# Patient Record
Sex: Female | Born: 1965 | Race: White | Hispanic: No | Marital: Married | Smoking: Never smoker
Health system: Southern US, Community
[De-identification: ages and names within clinical notes are randomized; demographics above are authoritative.]

## PROBLEM LIST (undated history)

## (undated) HISTORY — PX: TUBAL LIGATION: SHX77

## (undated) HISTORY — PX: FRACTURE SURGERY: SHX138

---

## 2008-02-13 ENCOUNTER — Encounter: Admission: RE | Admit: 2008-02-13 | Discharge: 2008-02-13 | Payer: Self-pay | Admitting: Obstetrics and Gynecology

## 2009-02-18 ENCOUNTER — Encounter: Admission: RE | Admit: 2009-02-18 | Discharge: 2009-02-18 | Payer: Self-pay | Admitting: Obstetrics and Gynecology

## 2010-03-06 ENCOUNTER — Encounter: Admission: RE | Admit: 2010-03-06 | Discharge: 2010-03-06 | Payer: Self-pay | Admitting: Obstetrics and Gynecology

## 2011-02-09 ENCOUNTER — Other Ambulatory Visit: Payer: Self-pay | Admitting: Obstetrics and Gynecology

## 2011-02-09 DIAGNOSIS — Z1231 Encounter for screening mammogram for malignant neoplasm of breast: Secondary | ICD-10-CM

## 2011-03-17 ENCOUNTER — Ambulatory Visit
Admission: RE | Admit: 2011-03-17 | Discharge: 2011-03-17 | Disposition: A | Discharge status: Home / Self Care | Payer: BC Managed Care – PPO | Source: Ambulatory Visit | Attending: Obstetrics and Gynecology | Admitting: Obstetrics and Gynecology

## 2011-03-17 DIAGNOSIS — Z1231 Encounter for screening mammogram for malignant neoplasm of breast: Secondary | ICD-10-CM

## 2012-02-11 ENCOUNTER — Other Ambulatory Visit: Payer: Self-pay | Admitting: Obstetrics and Gynecology

## 2012-02-11 DIAGNOSIS — Z1231 Encounter for screening mammogram for malignant neoplasm of breast: Secondary | ICD-10-CM

## 2012-03-23 ENCOUNTER — Ambulatory Visit
Admission: RE | Admit: 2012-03-23 | Discharge: 2012-03-23 | Disposition: A | Discharge status: Home / Self Care | Payer: BC Managed Care – PPO | Source: Ambulatory Visit | Attending: Obstetrics and Gynecology | Admitting: Obstetrics and Gynecology

## 2012-03-23 DIAGNOSIS — Z1231 Encounter for screening mammogram for malignant neoplasm of breast: Secondary | ICD-10-CM

## 2012-12-25 ENCOUNTER — Other Ambulatory Visit: Payer: Self-pay

## 2012-12-25 DIAGNOSIS — Z1231 Encounter for screening mammogram for malignant neoplasm of breast: Secondary | ICD-10-CM

## 2013-03-26 ENCOUNTER — Ambulatory Visit
Admission: RE | Admit: 2013-03-26 | Discharge: 2013-03-26 | Disposition: A | Discharge status: Home / Self Care | Payer: BC Managed Care – PPO | Source: Ambulatory Visit

## 2013-03-26 DIAGNOSIS — Z1231 Encounter for screening mammogram for malignant neoplasm of breast: Secondary | ICD-10-CM

## 2014-02-18 ENCOUNTER — Other Ambulatory Visit: Payer: Self-pay

## 2014-02-18 DIAGNOSIS — Z1239 Encounter for other screening for malignant neoplasm of breast: Secondary | ICD-10-CM

## 2014-04-04 ENCOUNTER — Ambulatory Visit
Admission: RE | Admit: 2014-04-04 | Discharge: 2014-04-04 | Disposition: A | Discharge status: Home / Self Care | Payer: BC Managed Care – PPO | Source: Ambulatory Visit

## 2014-04-04 ENCOUNTER — Other Ambulatory Visit: Payer: Self-pay

## 2014-04-04 DIAGNOSIS — Z1231 Encounter for screening mammogram for malignant neoplasm of breast: Secondary | ICD-10-CM

## 2015-03-19 ENCOUNTER — Other Ambulatory Visit: Payer: Self-pay

## 2015-03-19 DIAGNOSIS — Z1231 Encounter for screening mammogram for malignant neoplasm of breast: Secondary | ICD-10-CM

## 2015-05-15 ENCOUNTER — Ambulatory Visit
Admission: RE | Admit: 2015-05-15 | Discharge: 2015-05-15 | Disposition: A | Discharge status: Home / Self Care | Payer: BLUE CROSS/BLUE SHIELD | Source: Ambulatory Visit

## 2015-05-15 DIAGNOSIS — Z1231 Encounter for screening mammogram for malignant neoplasm of breast: Secondary | ICD-10-CM

## 2016-04-28 ENCOUNTER — Other Ambulatory Visit: Payer: Self-pay | Admitting: Student

## 2016-04-28 ENCOUNTER — Other Ambulatory Visit: Payer: Self-pay | Admitting: Obstetrics and Gynecology

## 2016-04-28 DIAGNOSIS — Z1231 Encounter for screening mammogram for malignant neoplasm of breast: Secondary | ICD-10-CM

## 2016-05-24 ENCOUNTER — Ambulatory Visit: Payer: BLUE CROSS/BLUE SHIELD

## 2016-05-25 ENCOUNTER — Ambulatory Visit
Admission: RE | Admit: 2016-05-25 | Discharge: 2016-05-25 | Disposition: A | Discharge status: Home / Self Care | Payer: BLUE CROSS/BLUE SHIELD | Source: Ambulatory Visit | Attending: Obstetrics and Gynecology | Admitting: Obstetrics and Gynecology

## 2016-05-25 DIAGNOSIS — Z1231 Encounter for screening mammogram for malignant neoplasm of breast: Secondary | ICD-10-CM

## 2017-05-04 ENCOUNTER — Other Ambulatory Visit: Payer: Self-pay | Admitting: Obstetrics and Gynecology

## 2017-05-04 DIAGNOSIS — Z1231 Encounter for screening mammogram for malignant neoplasm of breast: Secondary | ICD-10-CM

## 2017-06-07 ENCOUNTER — Ambulatory Visit
Admission: RE | Admit: 2017-06-07 | Discharge: 2017-06-07 | Disposition: A | Discharge status: Home / Self Care | Payer: BLUE CROSS/BLUE SHIELD | Source: Ambulatory Visit | Attending: Obstetrics and Gynecology | Admitting: Obstetrics and Gynecology

## 2017-06-07 DIAGNOSIS — Z1231 Encounter for screening mammogram for malignant neoplasm of breast: Secondary | ICD-10-CM

## 2018-07-07 ENCOUNTER — Other Ambulatory Visit: Payer: Self-pay | Admitting: Obstetrics and Gynecology

## 2018-07-07 DIAGNOSIS — Z1231 Encounter for screening mammogram for malignant neoplasm of breast: Secondary | ICD-10-CM

## 2018-08-02 ENCOUNTER — Ambulatory Visit: Payer: BLUE CROSS/BLUE SHIELD

## 2018-08-09 ENCOUNTER — Ambulatory Visit: Payer: BLUE CROSS/BLUE SHIELD

## 2019-09-26 LAB — RESULTS CONSOLE HPV: CHL HPV: NEGATIVE

## 2019-09-26 LAB — HM PAP SMEAR: HM Pap smear: NEGATIVE

## 2019-09-28 ENCOUNTER — Other Ambulatory Visit: Payer: Self-pay | Admitting: Obstetrics and Gynecology

## 2019-09-28 DIAGNOSIS — Z1231 Encounter for screening mammogram for malignant neoplasm of breast: Secondary | ICD-10-CM

## 2020-01-03 ENCOUNTER — Other Ambulatory Visit: Payer: Self-pay | Admitting: *Deleted

## 2020-01-03 DIAGNOSIS — Z1231 Encounter for screening mammogram for malignant neoplasm of breast: Secondary | ICD-10-CM

## 2020-07-15 ENCOUNTER — Other Ambulatory Visit: Payer: Self-pay

## 2020-07-15 ENCOUNTER — Ambulatory Visit (INDEPENDENT_AMBULATORY_CARE_PROVIDER_SITE_OTHER): Payer: 59 | Admitting: Nurse Practitioner

## 2020-07-15 ENCOUNTER — Encounter: Payer: Self-pay | Admitting: Nurse Practitioner

## 2020-07-15 VITALS — BP 130/68 | HR 80 | Temp 97.6°F | Ht 64.5 in | Wt 168.0 lb

## 2020-07-15 DIAGNOSIS — Z7689 Persons encountering health services in other specified circumstances: Secondary | ICD-10-CM

## 2020-07-15 DIAGNOSIS — Z1231 Encounter for screening mammogram for malignant neoplasm of breast: Secondary | ICD-10-CM | POA: Diagnosis not present

## 2020-07-15 DIAGNOSIS — Z1211 Encounter for screening for malignant neoplasm of colon: Secondary | ICD-10-CM

## 2020-07-15 NOTE — Progress Notes (Signed)
New Patient Office Visit  Subjective:  Patient ID: Monique Riggs, female    DOB: [birth year]  Age: 55 y.o. MRN: [id number]  CC:  Chief Complaint  Patient presents with  . New to establish    HPI Monique Riggs is a 55 year old Caucasian female presents to establish care with a primary care provider. She has received COVID-19, flu, and shingles immunizations. She is up-to-date on dental and eye exams. She has had recent pap smear. She is due for a screening colonoscopy and mammogram. She denies any acute medical problems. She will return to office for fasting labs.   History reviewed. No pertinent past medical history.  Past Surgical History:  Procedure Laterality Date  . CESAREAN SECTION     2    Family History  Problem Relation Age of Onset  . Prostate cancer Father   . Obesity Sister   . Breast cancer Neg Hx     Social History   Socioeconomic History  . Marital status: Married    Spouse name: Not on file  . Number of children: 2  . Years of education: Not on file  . Highest education level: Not on file  Occupational History  . Occupation: Associate Professor  Tobacco Use  . Smoking status: Never Smoker  . Smokeless tobacco: Never Used  Vaping Use  . Vaping Use: Never used  Substance and Sexual Activity  . Alcohol use: Not Currently  . Drug use: Never  . Sexual activity: Not on file  Other Topics Concern  . Not on file  Social History Narrative  . Not on file   Social Determinants of Health   Financial Resource Strain: Not on file  Food Insecurity: Not on file  Transportation Needs: Not on file  Physical Activity: Not on file  Stress: Not on file  Social Connections: Not on file  Intimate Partner Violence: Not on file    ROS Review of Systems  Constitutional: Negative for appetite change, fatigue and unexpected weight change.  HENT: Negative for congestion, ear pain, rhinorrhea, sinus pressure, sinus pain and tinnitus.   Eyes: Negative for pain.   Respiratory: Negative for cough and shortness of breath.   Cardiovascular: Negative for chest pain, palpitations and leg swelling.  Gastrointestinal: Negative for abdominal pain, constipation, diarrhea, nausea and vomiting.  Endocrine: Negative for cold intolerance, heat intolerance, polydipsia, polyphagia and polyuria.  Genitourinary: Negative for dysuria, frequency and hematuria.  Musculoskeletal: Negative for arthralgias, back pain, joint swelling and myalgias.  Skin: Negative for rash.  Allergic/Immunologic: Negative for environmental allergies.  Neurological: Negative for dizziness and headaches.  Hematological: Negative for adenopathy.  Psychiatric/Behavioral: Negative for decreased concentration and sleep disturbance. The patient is not nervous/anxious.     Objective:   Today's Vitals: BP 130/68 (BP Location: Left Arm, Patient Position: Sitting)   Pulse 80   Temp 97.6 F (36.4 C) (Temporal)   Ht 5' 4.5" (1.638 m)   Wt 168 lb (76.2 kg)   SpO2 97%   BMI 28.39 kg/m   Physical Exam Vitals reviewed.  Constitutional:      Appearance: Normal appearance.  HENT:     Head: Normocephalic.     Right Ear: Tympanic membrane normal.     Left Ear: Tympanic membrane normal.     Nose: Nose normal.     Mouth/Throat:     Mouth: Mucous membranes are moist.  Eyes:     Pupils: Pupils are equal, round, and reactive to light.  Cardiovascular:  Rate and Rhythm: Normal rate and regular rhythm.     Pulses: Normal pulses.     Heart sounds: Normal heart sounds.  Pulmonary:     Effort: Pulmonary effort is normal.     Breath sounds: Normal breath sounds.  Abdominal:     General: Bowel sounds are normal.     Palpations: Abdomen is soft.  Musculoskeletal:        General: Normal range of motion.     Cervical back: Neck supple.  Skin:    General: Skin is warm and dry.     Capillary Refill: Capillary refill takes less than 2 seconds.  Neurological:     General: No focal deficit present.      Mental Status: She is alert and oriented to person, place, and time.  Psychiatric:        Mood and Affect: Mood normal.        Behavior: Behavior normal.     Assessment & Plan:    1. Encounter for screening for malignant neoplasm of colon - Ambulatory referral to Gastroenterology  2. Encounter for screening mammogram for malignant neoplasm of breast  3. Encounter to establish care - CBC with Differential/Platelet; Future - Comprehensive metabolic panel; Future - Lipid panel; Future - TSH; Future   No outpatient encounter medications on file as of 07/15/2020.   No facility-administered encounter medications on file as of 07/15/2020.   Return on March 23rd, 2022 for fasting labs  We will call you with appointment times for mammogram on March 28th and colonoscopy referral  Follow-up in 1 year  Follow-up: 1-year  Marc Morning, NP

## 2020-07-15 NOTE — Patient Instructions (Addendum)
Return on March 23rd, 2022 for fasting labs  We will call you with appointment times for mammogram on March 28th and colonoscopy referral  Follow-up in 1 year  Preventive Care 53-53 Years Old, Female Preventive care refers to lifestyle choices and visits with your health care provider that can promote health and wellness. This includes:  A yearly physical exam. This is also called an annual wellness visit.  Regular dental and eye exams.  Immunizations.  Screening for certain conditions.  Healthy lifestyle choices, such as: ? Eating a healthy diet. ? Getting regular exercise. ? Not using drugs or products that contain nicotine and tobacco. ? Limiting alcohol use. What can I expect for my preventive care visit? Physical exam Your health care provider will check your:  Height and weight. These may be used to calculate your BMI (body mass index). BMI is a measurement that tells if you are at a healthy weight.  Heart rate and blood pressure.  Body temperature.  Skin for abnormal spots. Counseling Your health care provider may ask you questions about your:  Past medical problems.  Family's medical history.  Alcohol, tobacco, and drug use.  Emotional well-being.  Home life and relationship well-being.  Sexual activity.  Diet, exercise, and sleep habits.  Work and work Statistician.  Access to firearms.  Method of birth control.  Menstrual cycle.  Pregnancy history. What immunizations do I need? Vaccines are usually given at various ages, according to a schedule. Your health care provider will recommend vaccines for you based on your age, medical history, and lifestyle or other factors, such as travel or where you work.   What tests do I need? Blood tests  Lipid and cholesterol levels. These may be checked every 5 years, or more often if you are over 16 years old.  Hepatitis C test.  Hepatitis B test. Screening  Lung cancer screening. You may have this  screening every year starting at age 57 if you have a 30-pack-year history of smoking and currently smoke or have quit within the past 15 years.  Colorectal cancer screening. ? All adults should have this screening starting at age 58 and continuing until age 34. ? Your health care provider may recommend screening at age 27 if you are at increased risk. ? You will have tests every 1-10 years, depending on your results and the type of screening test.  Diabetes screening. ? This is done by checking your blood sugar (glucose) after you have not eaten for a while (fasting). ? You may have this done every 1-3 years.  Mammogram. ? This may be done every 1-2 years. ? Talk with your health care provider about when you should start having regular mammograms. This may depend on whether you have a family history of breast cancer.  BRCA-related cancer screening. This may be done if you have a family history of breast, ovarian, tubal, or peritoneal cancers.  Pelvic exam and Pap test. ? This may be done every 3 years starting at age 71. ? Starting at age 75, this may be done every 5 years if you have a Pap test in combination with an HPV test. Other tests  STD (sexually transmitted disease) testing, if you are at risk.  Bone density scan. This is done to screen for osteoporosis. You may have this scan if you are at high risk for osteoporosis. Talk with your health care provider about your test results, treatment options, and if necessary, the need for more tests. Follow these  instructions at home: Eating and drinking  Eat a diet that includes fresh fruits and vegetables, whole grains, lean protein, and low-fat dairy products.  Take vitamin and mineral supplements as recommended by your health care provider.  Do not drink alcohol if: ? Your health care provider tells you not to drink. ? You are pregnant, may be pregnant, or are planning to become pregnant.  If you drink alcohol: ? Limit how  much you have to 0-1 drink a day. ? Be aware of how much alcohol is in your drink. In the [place] one drink equals one 12 oz bottle of beer (355 mL), one 5 oz glass of wine (148 mL), or one 1 oz glass of hard liquor (44 mL).   Lifestyle  Take daily care of your teeth and gums. Brush your teeth every morning and night with fluoride toothpaste. Floss one time each day.  Stay active. Exercise for at least 30 minutes 5 or more days each week.  Do not use any products that contain nicotine or tobacco, such as cigarettes, e-cigarettes, and chewing tobacco. If you need help quitting, ask your health care provider.  Do not use drugs.  If you are sexually active, practice safe sex. Use a condom or other form of protection to prevent STIs (sexually transmitted infections).  If you do not wish to become pregnant, use a form of birth control. If you plan to become pregnant, see your health care provider for a prepregnancy visit.  If told by your health care provider, take low-dose aspirin daily starting at age 61.  Find healthy ways to cope with stress, such as: ? Meditation, yoga, or listening to music. ? Journaling. ? Talking to a trusted person. ? Spending time with friends and family. Safety  Always wear your seat belt while driving or riding in a vehicle.  Do not drive: ? If you have been drinking alcohol. Do not ride with someone who has been drinking. ? When you are tired or distracted. ? While texting.  Wear a helmet and other protective equipment during sports activities.  If you have firearms in your house, make sure you follow all gun safety procedures. What's next?  Visit your health care provider once a year for an annual wellness visit.  Ask your health care provider how often you should have your eyes and teeth checked.  Stay up to date on all vaccines. This information is not intended to replace advice given to you by your health care provider. Make sure you discuss any  questions you have with your health care provider. Document Revised: 01/22/2020 Document Reviewed: 12/29/2017 Elsevier Patient Education  2021 [place] Maintenance, Female Adopting a healthy lifestyle and getting preventive care are important in promoting health and wellness. Ask your health care provider about:  The right schedule for you to have regular tests and exams.  Things you can do on your own to prevent diseases and keep yourself healthy. What should I know about diet, weight, and exercise? Eat a healthy diet  Eat a diet that includes plenty of vegetables, fruits, low-fat dairy products, and lean protein.  Do not eat a lot of foods that are high in solid fats, added sugars, or sodium.   Maintain a healthy weight Body mass index (BMI) is used to identify weight problems. It estimates body fat based on height and weight. Your health care provider can help determine your BMI and help you achieve or maintain a healthy weight. Get regular exercise  Get regular exercise. This is one of the most important things you can do for your health. Most adults should:  Exercise for at least 150 minutes each week. The exercise should increase your heart rate and make you sweat (moderate-intensity exercise).  Do strengthening exercises at least twice a week. This is in addition to the moderate-intensity exercise.  Spend less time sitting. Even light physical activity can be beneficial. Watch cholesterol and blood lipids Have your blood tested for lipids and cholesterol at 53 years of age, then have this test every 5 years. Have your cholesterol levels checked more often if:  Your lipid or cholesterol levels are high.  You are older than 53 years of age.  You are at high risk for heart disease. What should I know about cancer screening? Depending on your health history and family history, you may need to have cancer screening at various ages. This may include screening  for:  Breast cancer.  Cervical cancer.  Colorectal cancer.  Skin cancer.  Lung cancer. What should I know about heart disease, diabetes, and high blood pressure? Blood pressure and heart disease  High blood pressure causes heart disease and increases the risk of stroke. This is more likely to develop in people who have high blood pressure readings, are of African descent, or are overweight.  Have your blood pressure checked: ? Every 3-5 years if you are 15-67 years of age. ? Every year if you are 64 years old or older. Diabetes Have regular diabetes screenings. This checks your fasting blood sugar level. Have the screening done:  Once every three years after age 17 if you are at a normal weight and have a low risk for diabetes.  More often and at a younger age if you are overweight or have a high risk for diabetes. What should I know about preventing infection? Hepatitis B If you have a higher risk for hepatitis B, you should be screened for this virus. Talk with your health care provider to find out if you are at risk for hepatitis B infection. Hepatitis C Testing is recommended for:  Everyone born from [birth year] through 1963.  Anyone with known risk factors for hepatitis C. Sexually transmitted infections (STIs)  Get screened for STIs, including gonorrhea and chlamydia, if: ? You are sexually active and are younger than 53 years of age. ? You are older than 53 years of age and your health care provider tells you that you are at risk for this type of infection. ? Your sexual activity has changed since you were last screened, and you are at increased risk for chlamydia or gonorrhea. Ask your health care provider if you are at risk.  Ask your health care provider about whether you are at high risk for HIV. Your health care provider may recommend a prescription medicine to help prevent HIV infection. If you choose to take medicine to prevent HIV, you should first get tested for HIV.  You should then be tested every 3 months for as long as you are taking the medicine. Pregnancy  If you are about to stop having your period (premenopausal) and you may become pregnant, seek counseling before you get pregnant.  Take 400 to 800 micrograms (mcg) of folic acid every day if you become pregnant.  Ask for birth control (contraception) if you want to prevent pregnancy. Osteoporosis and menopause Osteoporosis is a disease in which the bones lose minerals and strength with aging. This can result in bone fractures. If you  are 30 years old or older, or if you are at risk for osteoporosis and fractures, ask your health care provider if you should:  Be screened for bone loss.  Take a calcium or vitamin D supplement to lower your risk of fractures.  Be given hormone replacement therapy (HRT) to treat symptoms of menopause. Follow these instructions at home: Lifestyle  Do not use any products that contain nicotine or tobacco, such as cigarettes, e-cigarettes, and chewing tobacco. If you need help quitting, ask your health care provider.  Do not use street drugs.  Do not share needles.  Ask your health care provider for help if you need support or information about quitting drugs. Alcohol use  Do not drink alcohol if: ? Your health care provider tells you not to drink. ? You are pregnant, may be pregnant, or are planning to become pregnant.  If you drink alcohol: ? Limit how much you use to 0-1 drink a day. ? Limit intake if you are breastfeeding.  Be aware of how much alcohol is in your drink. In the [place] one drink equals one 12 oz bottle of beer (355 mL), one 5 oz glass of wine (148 mL), or one 1 oz glass of hard liquor (44 mL). General instructions  Schedule regular health, dental, and eye exams.  Stay current with your vaccines.  Tell your health care provider if: ? You often feel depressed. ? You have ever been abused or do not feel safe at  home. Summary  Adopting a healthy lifestyle and getting preventive care are important in promoting health and wellness.  Follow your health care provider's instructions about healthy diet, exercising, and getting tested or screened for diseases.  Follow your health care provider's instructions on monitoring your cholesterol and blood pressure. This information is not intended to replace advice given to you by your health care provider. Make sure you discuss any questions you have with your health care provider. Document Revised: 04/12/2018 Document Reviewed: 04/12/2018 Elsevier Patient Education  2021 [employer].

## 2020-07-23 ENCOUNTER — Other Ambulatory Visit: Payer: 59

## 2020-07-23 ENCOUNTER — Encounter: Payer: Self-pay | Admitting: Legal Medicine

## 2020-07-23 ENCOUNTER — Other Ambulatory Visit: Payer: Self-pay

## 2020-07-23 ENCOUNTER — Ambulatory Visit (INDEPENDENT_AMBULATORY_CARE_PROVIDER_SITE_OTHER): Payer: 59 | Admitting: Legal Medicine

## 2020-07-23 VITALS — BP 118/80 | HR 57 | Temp 97.5°F | Resp 16 | Ht 64.5 in | Wt 167.4 lb

## 2020-07-23 DIAGNOSIS — H6090 Unspecified otitis externa, unspecified ear: Secondary | ICD-10-CM | POA: Insufficient documentation

## 2020-07-23 DIAGNOSIS — Z7689 Persons encountering health services in other specified circumstances: Secondary | ICD-10-CM

## 2020-07-23 DIAGNOSIS — H60391 Other infective otitis externa, right ear: Secondary | ICD-10-CM

## 2020-07-23 MED ORDER — AZITHROMYCIN 250 MG PO TABS: ORAL_TABLET | ORAL | 0 refills | Status: DC

## 2020-07-23 MED ORDER — NEOMYCIN-POLYMYXIN-HC 3.5-10000-1 OT SOLN: 3.0000 [drp] | Freq: Four times a day (QID) | OTIC | 0 refills | Status: DC

## 2020-07-23 NOTE — Progress Notes (Signed)
Subjective:  Patient ID: Monique Riggs, female    DOB: October 31, 1965  Age: 55 y.o. MRN: [id number]  Chief Complaint  Patient presents with  . Ear Pain    R ear; Patient states she was here last week and Carollee Herter told her that her ear was red but it was not bothering her at time but now it is sore.     HPI: patient having otalgia, she was seen one week ago.no popping, constant pain, no fever or chills, no congestion.   No current outpatient medications on file prior to visit.   No current facility-administered medications on file prior to visit.   History reviewed. No pertinent past medical history. Past Surgical History:  Procedure Laterality Date  . CESAREAN SECTION     2    Family History  Problem Relation Age of Onset  . Prostate cancer Father   . Obesity Sister   . Breast cancer Neg Hx    Social History   Socioeconomic History  . Marital status: Married    Spouse name: Not on file  . Number of children: 2  . Years of education: Not on file  . Highest education level: Not on file  Occupational History  . Occupation: Associate Professor  Tobacco Use  . Smoking status: Never Smoker  . Smokeless tobacco: Never Used  Vaping Use  . Vaping Use: Never used  Substance and Sexual Activity  . Alcohol use: Not Currently  . Drug use: Never  . Sexual activity: Not on file  Other Topics Concern  . Not on file  Social History Narrative  . Not on file   Social Determinants of Health   Financial Resource Strain: Not on file  Food Insecurity: Not on file  Transportation Needs: Not on file  Physical Activity: Not on file  Stress: Not on file  Social Connections: Not on file    Review of Systems  Constitutional: Negative for activity change and appetite change.  HENT: Positive for ear pain.   Eyes: Negative for visual disturbance.  Respiratory: Negative for chest tightness and shortness of breath.   Cardiovascular: Negative for chest pain, palpitations and leg swelling.   Endocrine: Negative for polyuria.  Genitourinary: Positive for urgency. Negative for dysuria.  Musculoskeletal: Negative.   Skin: Negative.      Objective:  BP 118/80 (BP Location: Right Arm, Patient Position: Sitting, Cuff Size: Normal)   Pulse (!) 57   Temp (!) 97.5 F (36.4 C) (Temporal)   Resp 16   Ht 5' 4.5" (1.638 m)   Wt 167 lb 6.4 oz (75.9 kg)   SpO2 99%   BMI 28.29 kg/m   BP/Weight 07/23/2020 07/15/2020  Systolic BP 118 130  Diastolic BP 80 68  Wt. (Lbs) 167.4 168  BMI 28.29 28.39    Physical Exam Vitals reviewed.  Constitutional:      Appearance: Normal appearance. She is normal weight.  HENT:     Left Ear: Tympanic membrane normal.     Ears:     Comments: Right TM red and bulging, the ear canal is red and swollen and tender Eyes:     Extraocular Movements: Extraocular movements intact.     Pupils: Pupils are equal, round, and reactive to light.  Cardiovascular:     Rate and Rhythm: Normal rate and regular rhythm.     Pulses: Normal pulses.     Heart sounds: Normal heart sounds. No murmur heard. No gallop.   Pulmonary:  Effort: Pulmonary effort is normal. No respiratory distress.     Breath sounds: Normal breath sounds. No rales.  Musculoskeletal:     Cervical back: Normal range of motion and neck supple.  Neurological:     Mental Status: She is alert.       No results found for: WBC, HGB, HCT, PLT, GLUCOSE, CHOL, TRIG, HDL, LDLDIRECT, LDLCALC, ALT, AST, NA, K, CL, CREATININE, BUN, CO2, TSH, PSA, INR, GLUF, HGBA1C, MICROALBUR    Assessment & Plan:   Diagnoses and all orders for this visit: Other infective acute otitis externa of right ear -     azithromycin (ZITHROMAX) 250 MG tablet; 2 tablets on day 1, then 1 tablet daily on days 2-6. -     neomycin-polymyxin-hydrocortisone (CORTISPORIN) OTIC solution; Place 3 drops into the left ear 4 (four) times daily. Treat with ear drops, use cotton, take antibiotics, call if not improved. Encounter  to establish care -     Comprehensive metabolic panel -     Lipid panel -     TSH -     CBC with Differential/Platelet  Labs for her physical last visit       I spent 15 minutes dedicated to the care of this patient on the date of this encounter to include face-to-face time with the patient, as well as:   Follow-up: Return if symptoms worsen or fail to improve.  An After Visit Summary was printed and given to the patient.  Brent Bulla, MD [facility] [phone]

## 2020-07-24 ENCOUNTER — Encounter: Payer: Self-pay | Admitting: Nurse Practitioner

## 2020-07-24 LAB — CBC WITH DIFFERENTIAL/PLATELET
Basophils Absolute: 0 10*3/uL (ref 0.0–0.2)
Basos: 1 %
EOS (ABSOLUTE): 0.1 10*3/uL (ref 0.0–0.4)
Eos: 2 %
Hematocrit: 44.8 % (ref 34.0–46.6)
Hemoglobin: 14.5 g/dL (ref 11.1–15.9)
Immature Grans (Abs): 0 10*3/uL (ref 0.0–0.1)
Immature Granulocytes: 0 %
Lymphocytes Absolute: 1.3 10*3/uL (ref 0.7–3.1)
Lymphs: 19 %
MCH: 30.5 pg (ref 26.6–33.0)
MCHC: 32.4 g/dL (ref 31.5–35.7)
MCV: 94 fL (ref 79–97)
Monocytes Absolute: 0.4 10*3/uL (ref 0.1–0.9)
Monocytes: 5 %
Neutrophils Absolute: 4.7 10*3/uL (ref 1.4–7.0)
Neutrophils: 73 %
Platelets: 242 10*3/uL (ref 150–450)
RBC: 4.75 x10E6/uL (ref 3.77–5.28)
RDW: 12.9 % (ref 11.7–15.4)
WBC: 6.6 10*3/uL (ref 3.4–10.8)

## 2020-07-24 LAB — COMPREHENSIVE METABOLIC PANEL
ALT: 11 IU/L (ref 0–32)
AST: 17 IU/L (ref 0–40)
Albumin/Globulin Ratio: 2.3 — ABNORMAL HIGH (ref 1.2–2.2)
Albumin: 4.5 g/dL (ref 3.8–4.9)
Alkaline Phosphatase: 107 IU/L (ref 44–121)
BUN/Creatinine Ratio: 23 (ref 9–23)
BUN: 16 mg/dL (ref 6–24)
Bilirubin Total: 0.3 mg/dL (ref 0.0–1.2)
CO2: 23 mmol/L (ref 20–29)
Calcium: 9.2 mg/dL (ref 8.7–10.2)
Chloride: 104 mmol/L (ref 96–106)
Creatinine, Ser: 0.69 mg/dL (ref 0.57–1.00)
Globulin, Total: 2 g/dL (ref 1.5–4.5)
Glucose: 86 mg/dL (ref 65–99)
Potassium: 4.4 mmol/L (ref 3.5–5.2)
Sodium: 142 mmol/L (ref 134–144)
Total Protein: 6.5 g/dL (ref 6.0–8.5)
eGFR: 104 mL/min/{1.73_m2} (ref >59)

## 2020-07-24 LAB — LIPID PANEL
Chol/HDL Ratio: 3 ratio (ref 0.0–4.4)
Cholesterol, Total: 151 mg/dL (ref 100–199)
HDL: 50 mg/dL (ref >39)
LDL Chol Calc (NIH): 88 mg/dL (ref 0–99)
Triglycerides: 62 mg/dL (ref 0–149)
VLDL Cholesterol Cal: 13 mg/dL (ref 5–40)

## 2020-07-24 LAB — CARDIOVASCULAR RISK ASSESSMENT

## 2020-07-24 LAB — TSH: TSH: 3.49 u[IU]/mL (ref 0.450–4.500)

## 2020-07-28 ENCOUNTER — Other Ambulatory Visit: Payer: Self-pay

## 2020-07-28 ENCOUNTER — Ambulatory Visit
Admission: RE | Admit: 2020-07-28 | Discharge: 2020-07-28 | Disposition: A | Discharge status: Home / Self Care | Payer: 59 | Source: Ambulatory Visit | Attending: Nurse Practitioner | Admitting: Nurse Practitioner

## 2020-07-28 DIAGNOSIS — Z1231 Encounter for screening mammogram for malignant neoplasm of breast: Secondary | ICD-10-CM

## 2020-08-06 ENCOUNTER — Encounter: Payer: Self-pay | Admitting: Nurse Practitioner

## 2021-07-22 ENCOUNTER — Encounter: Payer: Self-pay | Admitting: Nurse Practitioner

## 2021-07-22 ENCOUNTER — Other Ambulatory Visit: Payer: Self-pay

## 2021-07-22 ENCOUNTER — Ambulatory Visit (INDEPENDENT_AMBULATORY_CARE_PROVIDER_SITE_OTHER): Payer: Managed Care, Other (non HMO) | Admitting: Nurse Practitioner

## 2021-07-22 VITALS — BP 110/80 | HR 81 | Temp 97.1°F | Ht 64.0 in | Wt 159.0 lb

## 2021-07-22 DIAGNOSIS — Z1211 Encounter for screening for malignant neoplasm of colon: Secondary | ICD-10-CM

## 2021-07-22 DIAGNOSIS — Z0000 Encounter for general adult medical examination without abnormal findings: Secondary | ICD-10-CM | POA: Diagnosis not present

## 2021-07-22 DIAGNOSIS — Z1231 Encounter for screening mammogram for malignant neoplasm of breast: Secondary | ICD-10-CM

## 2021-07-22 NOTE — Patient Instructions (Signed)
Continue heart healthy diet ?Continue physical activity ?Follow-up in 1-year ? ?Preventive Care 21-54 Years Old, Female ?Preventive care refers to lifestyle choices and visits with your health care provider that can promote health and wellness. Preventive care visits are also called wellness exams. ?What can I expect for my preventive care visit? ?Counseling ?Your health care provider may ask you questions about your: ?Medical history, including: ?Past medical problems. ?Family medical history. ?Pregnancy history. ?Current health, including: ?Menstrual cycle. ?Method of birth control. ?Emotional well-being. ?Home life and relationship well-being. ?Sexual activity and sexual health. ?Lifestyle, including: ?Alcohol, nicotine or tobacco, and drug use. ?Access to firearms. ?Diet, exercise, and sleep habits. ?Work and work Statistician. ?Sunscreen use. ?Safety issues such as seatbelt and bike helmet use. ?Physical exam ?Your health care provider will check your: ?Height and weight. These may be used to calculate your BMI (body mass index). BMI is a measurement that tells if you are at a healthy weight. ?Waist circumference. This measures the distance around your waistline. This measurement also tells if you are at a healthy weight and may help predict your risk of certain diseases, such as type 2 diabetes and high blood pressure. ?Heart rate and blood pressure. ?Body temperature. ?Skin for abnormal spots. ?What immunizations do I need? ?Vaccines are usually given at various ages, according to a schedule. Your health care provider will recommend vaccines for you based on your age, medical history, and lifestyle or other factors, such as travel or where you work. ?What tests do I need? ?Screening ?Your health care provider may recommend screening tests for certain conditions. This may include: ?Lipid and cholesterol levels. ?Diabetes screening. This is done by checking your blood sugar (glucose) after you have not eaten for  a while (fasting). ?Pelvic exam and Pap test. ?Hepatitis B test. ?Hepatitis C test. ?HIV (human immunodeficiency virus) test. ?STI (sexually transmitted infection) testing, if you are at risk. ?Lung cancer screening. ?Colorectal cancer screening. ?Mammogram. Talk with your health care provider about when you should start having regular mammograms. This may depend on whether you have a family history of breast cancer. ?BRCA-related cancer screening. This may be done if you have a family history of breast, ovarian, tubal, or peritoneal cancers. ?Bone density scan. This is done to screen for osteoporosis. ?Talk with your health care provider about your test results, treatment options, and if necessary, the need for more tests. ?Follow these instructions at home: ?Eating and drinking ? ?Eat a diet that includes fresh fruits and vegetables, whole grains, lean protein, and low-fat dairy products. ?Take vitamin and mineral supplements as recommended by your health care provider. ?Do not drink alcohol if: ?Your health care provider tells you not to drink. ?You are pregnant, may be pregnant, or are planning to become pregnant. ?If you drink alcohol: ?Limit how much you have to 0-1 drink a day. ?Know how much alcohol is in your drink. In the [place] one drink equals one 12 oz bottle of beer (355 mL), one 5 oz glass of wine (148 mL), or one 1? oz glass of hard liquor (44 mL). ?Lifestyle ?Brush your teeth every morning and night with fluoride toothpaste. Floss one time each day. ?Exercise for at least 30 minutes 5 or more days each week. ?Do not use any products that contain nicotine or tobacco. These products include cigarettes, chewing tobacco, and vaping devices, such as e-cigarettes. If you need help quitting, ask your health care provider. ?Do not use drugs. ?If you are sexually active, practice safe  sex. Use a condom or other form of protection to prevent STIs. ?If you do not wish to become pregnant, use a form of birth  control. If you plan to become pregnant, see your health care provider for a prepregnancy visit. ?Take aspirin only as told by your health care provider. Make sure that you understand how much to take and what form to take. Work with your health care provider to find out whether it is safe and beneficial for you to take aspirin daily. ?Find healthy ways to manage stress, such as: ?Meditation, yoga, or listening to music. ?Journaling. ?Talking to a trusted person. ?Spending time with friends and family. ?Minimize exposure to UV radiation to reduce your risk of skin cancer. ?Safety ?Always wear your seat belt while driving or riding in a vehicle. ?Do not drive: ?If you have been drinking alcohol. Do not ride with someone who has been drinking. ?When you are tired or distracted. ?While texting. ?If you have been using any mind-altering substances or drugs. ?Wear a helmet and other protective equipment during sports activities. ?If you have firearms in your house, make sure you follow all gun safety procedures. ?Seek help if you have been physically or sexually abused. ?What's next? ?Visit your health care provider once a year for an annual wellness visit. ?Ask your health care provider how often you should have your eyes and teeth checked. ?Stay up to date on all vaccines. ?This information is not intended to replace advice given to you by your health care provider. Make sure you discuss any questions you have with your health care provider. ?Document Revised: 10/15/2020 Document Reviewed: 10/15/2020 ?Elsevier Patient Education ? [place]. ? ?

## 2021-07-22 NOTE — Progress Notes (Signed)
? ?Subjective:  ?Patient ID: Monique Riggs, female    DOB: [birth year]  Age: 56 y.o. MRN: [id number] ? ?Chief Complaint  ?Patient presents with  ? Annual Exam  ? ? ?HPI ?Encounter for general adult medical examination without abnormal findings  ?Physical: Patient's last physical exam was 1 year ago .  ? ?   ?SDOH Screenings  ? ?Alcohol Screen: Low Risk   ? Last Alcohol Screening Score (AUDIT): 0  ?Depression (PHQ2-9): Low Risk   ? PHQ-2 Score: 0  ?Financial Resource Strain: Low Risk   ? Difficulty of Paying Living Expenses: Not hard at all  ?Food Insecurity: No Food Insecurity  ? Worried About Programme researcher, broadcasting/film/video in the Last Year: Never true  ? Ran Out of Food in the Last Year: Never true  ?Housing: Low Risk   ? Last Housing Risk Score: 0  ?Physical Activity: Inactive  ? Days of Exercise per Week: 0 days  ? Minutes of Exercise per Session: 0 min  ?Social Connections: Moderately Integrated  ? Frequency of Communication with Friends and Family: Three times a week  ? Frequency of Social Gatherings with Friends and Family: Three times a week  ? Attends Religious Services: More than 4 times per year  ? Active Member of Clubs or Organizations: No  ? Attends Banker Meetings: Never  ? Marital Status: Married  ?Stress: No Stress Concern Present  ? Feeling of Stress : Not at all  ?Tobacco Use: Low Risk   ? Smoking Tobacco Use: Never  ? Smokeless Tobacco Use: Never  ? Passive Exposure: Not on file  ?Transportation Needs: No Transportation Needs  ? Lack of Transportation (Medical): No  ? Lack of Transportation (Non-Medical): No  ?  ? ?  07/22/2021  ? 10:29 AM 07/15/2020  ?  1:57 PM  ?Fall Risk   ?Falls in the past year? 0 0  ?Number falls in past yr: 0 0  ?Injury with Fall? 0 0  ?Risk for fall due to : No Fall Risks   ?Follow up Falls evaluation completed Falls evaluation completed  ?  ? ?  07/22/2021  ? 10:29 AM 07/15/2020  ?  2:03 PM  ?Depression screen PHQ 2/9  ?Decreased Interest 0 0  ?Down, Depressed, Hopeless 0 0   ?PHQ - 2 Score 0 0  ?  ?Functional Status Survey: ?   ? ?Safety: reviewed ;  ?Patient wears a seat belt. ?Patient's home has smoke detectors and carbon monoxide detectors. ?Patient practices appropriate gun safety ?Patient wears sunscreen with extended sun exposure. ?Dental Care: biannual cleanings, brushes daily but does not floss daily. ?Ophthalmology/Optometry: Annual visit.  ?Hearing loss: none ?Vision impairments: Contacts ?Patient is not afflicted from Stress Incontinence and Urge Incontinence  ? ?No current outpatient medications on file prior to visit.  ? ?No current facility-administered medications on file prior to visit.  ?  ?Social Hx   ?Social History  ? ?Socioeconomic History  ? Marital status: Married  ?  Spouse name: Monique Riggs  ? Number of children: 2  ? Years of education: Not on file  ? Highest education level: Not on file  ?Occupational History  ? Occupation: Associate Professor  ?Tobacco Use  ? Smoking status: Never  ? Smokeless tobacco: Never  ?Vaping Use  ? Vaping Use: Never used  ?Substance and Sexual Activity  ? Alcohol use: Not Currently  ? Drug use: Never  ? Sexual activity: Yes  ?  Partners: Male  ?Other Topics  Concern  ? Not on file  ?Social History Narrative  ? Not on file  ? ?Social Determinants of Health  ? ?Financial Resource Strain: Low Risk   ? Difficulty of Paying Living Expenses: Not hard at all  ?Food Insecurity: No Food Insecurity  ? Worried About Programme researcher, broadcasting/film/videounning Out of Food in the Last Year: Never true  ? Ran Out of Food in the Last Year: Never true  ?Transportation Needs: No Transportation Needs  ? Lack of Transportation (Medical): No  ? Lack of Transportation (Non-Medical): No  ?Physical Activity: Inactive  ? Days of Exercise per Week: 0 days  ? Minutes of Exercise per Session: 0 min  ?Stress: No Stress Concern Present  ? Feeling of Stress : Not at all  ?Social Connections: Moderately Integrated  ? Frequency of Communication with Friends and Family: Three times a week  ? Frequency of  Social Gatherings with Friends and Family: Three times a week  ? Attends Religious Services: More than 4 times per year  ? Active Member of Clubs or Organizations: No  ? Attends BankerClub or Organization Meetings: Never  ? Marital Status: Married  ? ?No past medical history on file. ?Family History  ?Problem Relation Age of Onset  ? Prostate cancer Father   ? Obesity Sister   ? Breast cancer Neg Hx   ? ? ?Review of Systems  ?Constitutional:  Positive for diaphoresis (night sweats due to menopause). Negative for chills, fatigue and fever.  ?HENT:  Negative for congestion, ear pain, rhinorrhea and sore throat.   ?Respiratory:  Negative for cough and shortness of breath.   ?Cardiovascular:  Negative for chest pain.  ?Gastrointestinal:  Negative for abdominal pain, constipation, diarrhea, nausea and vomiting.  ?Genitourinary:  Negative for dysuria and urgency.  ?Musculoskeletal:  Negative for back pain and myalgias.  ?Neurological:  Negative for dizziness, weakness, light-headedness and headaches.  ?Psychiatric/Behavioral:  Negative for dysphoric mood. The patient is not nervous/anxious.   ? ? ?Objective:  ?BP 110/80   Pulse 81   Temp (!) 97.1 ?F (36.2 ?C)   Ht 5\' 4"  (1.626 m)   Wt 159 lb (72.1 kg)   LMP 06/05/2017   SpO2 97%   BMI 27.29 kg/m?  ? ? ?  07/22/2021  ? 10:32 AM 07/23/2020  ?  9:45 AM 07/15/2020  ?  2:08 PM  ?BP/Weight  ?Systolic BP 110 118 130  ?Diastolic BP 80 80 68  ?Wt. (Lbs) 159 167.4 168  ?BMI 27.29 kg/m2 28.29 kg/m2 28.39 kg/m2  ? ? ?Physical Exam ?Vitals reviewed.  ?Constitutional:   ?   Appearance: Normal appearance.  ?HENT:  ?   Head: Normocephalic.  ?   Right Ear: Tympanic membrane normal.  ?   Left Ear: Tympanic membrane normal.  ?   Nose: Nose normal.  ?   Mouth/Throat:  ?   Mouth: Mucous membranes are moist.  ?Eyes:  ?   Pupils: Pupils are equal, round, and reactive to light.  ?Cardiovascular:  ?   Rate and Rhythm: Normal rate and regular rhythm.  ?   Pulses: Normal pulses.  ?   Heart sounds:  Normal heart sounds.  ?Pulmonary:  ?   Effort: Pulmonary effort is normal.  ?   Breath sounds: Normal breath sounds.  ?Abdominal:  ?   General: Bowel sounds are normal.  ?   Palpations: Abdomen is soft.  ?Musculoskeletal:     ?   General: Normal range of motion.  ?   Cervical back:  Neck supple.  ?Skin: ?   General: Skin is warm and dry.  ?   Capillary Refill: Capillary refill takes less than 2 seconds.  ?Neurological:  ?   General: No focal deficit present.  ?   Mental Status: She is alert and oriented to person, place, and time.  ?Psychiatric:     ?   Mood and Affect: Mood normal.     ?   Behavior: Behavior normal.  ? ? ?Lab Results  ?Component Value Date  ? WBC 6.6 07/23/2020  ? HGB 14.5 07/23/2020  ? HCT 44.8 07/23/2020  ? PLT 242 07/23/2020  ? GLUCOSE 86 07/23/2020  ? CHOL 151 07/23/2020  ? TRIG 62 07/23/2020  ? HDL 50 07/23/2020  ? LDLCALC 88 07/23/2020  ? ALT 11 07/23/2020  ? AST 17 07/23/2020  ? NA 142 07/23/2020  ? K 4.4 07/23/2020  ? CL 104 07/23/2020  ? CREATININE 0.69 07/23/2020  ? BUN 16 07/23/2020  ? CO2 23 07/23/2020  ? TSH 3.490 07/23/2020  ? ? ? ? ?Assessment & Plan:  ?  ?1. General medical exam ?- CBC with Differential/Platelet ?- Comprehensive metabolic panel ?- TSH ?- Lipid Panel ? ?2. Screening for colon cancer ?- Ambulatory referral to Gastroenterology ? ?3. Screening mammogram for breast cancer ?- MM DIGITAL SCREENING BILATERAL; Future ?  ? ? ?These are the goals we discussed: ? Goals   ?Obtain colonoscopy and mammogram ?  ?  ? ?This is a list of the screening recommended for you and due dates:  ?Health Maintenance  ?Topic Date Due  ? Zoster (Shingles) Vaccine (1 of 2) Never done  ? Pap Smear  07/22/2021*  ? Colon Cancer Screening  07/23/2022*  ? Mammogram  07/29/2022  ? Tetanus Vaccine  01/23/2031  ? Flu Shot  Completed  ? HPV Vaccine  Aged Out  ? COVID-19 Vaccine  Discontinued  ? Hepatitis C Screening: USPSTF Recommendation to screen - Ages 35-79 yo.  Discontinued  ? HIV Screening   Discontinued  ?*Topic was postponed. The date shown is not the original due date.  ?  ?Continue heart healthy diet ?Continue physical activity ?Follow-up in 1-year ? ?AN INDIVIDUALIZED CARE PLAN: was established or rein

## 2021-07-23 LAB — CBC WITH DIFFERENTIAL/PLATELET
Basophils Absolute: 0 10*3/uL (ref 0.0–0.2)
Basos: 1 %
EOS (ABSOLUTE): 0.1 10*3/uL (ref 0.0–0.4)
Eos: 1 %
Hematocrit: 45.2 % (ref 34.0–46.6)
Hemoglobin: 15.1 g/dL (ref 11.1–15.9)
Immature Grans (Abs): 0 10*3/uL (ref 0.0–0.1)
Immature Granulocytes: 0 %
Lymphocytes Absolute: 1.2 10*3/uL (ref 0.7–3.1)
Lymphs: 15 %
MCH: 31.2 pg (ref 26.6–33.0)
MCHC: 33.4 g/dL (ref 31.5–35.7)
MCV: 93 fL (ref 79–97)
Monocytes Absolute: 0.5 10*3/uL (ref 0.1–0.9)
Monocytes: 6 %
Neutrophils Absolute: 6.3 10*3/uL (ref 1.4–7.0)
Neutrophils: 77 %
Platelets: 264 10*3/uL (ref 150–450)
RBC: 4.84 x10E6/uL (ref 3.77–5.28)
RDW: 12.7 % (ref 11.7–15.4)
WBC: 8.1 10*3/uL (ref 3.4–10.8)

## 2021-07-23 LAB — LIPID PANEL
Chol/HDL Ratio: 2.8 ratio (ref 0.0–4.4)
Cholesterol, Total: 164 mg/dL (ref 100–199)
HDL: 59 mg/dL (ref >39)
LDL Chol Calc (NIH): 90 mg/dL (ref 0–99)
Triglycerides: 77 mg/dL (ref 0–149)
VLDL Cholesterol Cal: 15 mg/dL (ref 5–40)

## 2021-07-23 LAB — TSH: TSH: 1.81 u[IU]/mL (ref 0.450–4.500)

## 2021-07-23 LAB — COMPREHENSIVE METABOLIC PANEL
ALT: 13 IU/L (ref 0–32)
AST: 14 IU/L (ref 0–40)
Albumin/Globulin Ratio: 2.5 — ABNORMAL HIGH (ref 1.2–2.2)
Albumin: 4.9 g/dL (ref 3.8–4.9)
Alkaline Phosphatase: 88 IU/L (ref 44–121)
BUN/Creatinine Ratio: 35 — ABNORMAL HIGH (ref 9–23)
BUN: 26 mg/dL — ABNORMAL HIGH (ref 6–24)
Bilirubin Total: 0.4 mg/dL (ref 0.0–1.2)
CO2: 25 mmol/L (ref 20–29)
Calcium: 9.8 mg/dL (ref 8.7–10.2)
Chloride: 104 mmol/L (ref 96–106)
Creatinine, Ser: 0.74 mg/dL (ref 0.57–1.00)
Globulin, Total: 2 g/dL (ref 1.5–4.5)
Glucose: 85 mg/dL (ref 70–99)
Potassium: 4.5 mmol/L (ref 3.5–5.2)
Sodium: 146 mmol/L — ABNORMAL HIGH (ref 134–144)
Total Protein: 6.9 g/dL (ref 6.0–8.5)
eGFR: 97 mL/min/{1.73_m2} (ref >59)

## 2021-07-23 LAB — CARDIOVASCULAR RISK ASSESSMENT

## 2021-07-28 ENCOUNTER — Other Ambulatory Visit: Payer: Self-pay | Admitting: Nurse Practitioner

## 2021-07-28 DIAGNOSIS — Z1231 Encounter for screening mammogram for malignant neoplasm of breast: Secondary | ICD-10-CM

## 2021-07-29 ENCOUNTER — Other Ambulatory Visit: Payer: Self-pay

## 2021-07-29 ENCOUNTER — Ambulatory Visit
Admission: RE | Admit: 2021-07-29 | Discharge: 2021-07-29 | Disposition: A | Discharge status: Home / Self Care | Payer: Managed Care, Other (non HMO) | Source: Ambulatory Visit | Attending: Nurse Practitioner | Admitting: Nurse Practitioner

## 2021-07-29 DIAGNOSIS — Z1231 Encounter for screening mammogram for malignant neoplasm of breast: Secondary | ICD-10-CM

## 2022-01-08 ENCOUNTER — Other Ambulatory Visit: Payer: Self-pay

## 2022-01-08 DIAGNOSIS — Z1211 Encounter for screening for malignant neoplasm of colon: Secondary | ICD-10-CM

## 2022-01-13 ENCOUNTER — Encounter: Payer: Self-pay | Admitting: Nurse Practitioner

## 2022-01-24 DIAGNOSIS — S8263XA Displaced fracture of lateral malleolus of unspecified fibula, initial encounter for closed fracture: Secondary | ICD-10-CM | POA: Diagnosis not present

## 2022-01-25 DIAGNOSIS — M25572 Pain in left ankle and joints of left foot: Secondary | ICD-10-CM | POA: Diagnosis not present

## 2022-01-28 DIAGNOSIS — S93432A Sprain of tibiofibular ligament of left ankle, initial encounter: Secondary | ICD-10-CM | POA: Diagnosis not present

## 2022-01-28 DIAGNOSIS — W19XXXA Unspecified fall, initial encounter: Secondary | ICD-10-CM | POA: Diagnosis not present

## 2022-01-28 DIAGNOSIS — X58XXXA Exposure to other specified factors, initial encounter: Secondary | ICD-10-CM | POA: Diagnosis not present

## 2022-01-28 DIAGNOSIS — S93492A Sprain of other ligament of left ankle, initial encounter: Secondary | ICD-10-CM | POA: Diagnosis not present

## 2022-01-28 DIAGNOSIS — S8262XA Displaced fracture of lateral malleolus of left fibula, initial encounter for closed fracture: Secondary | ICD-10-CM | POA: Diagnosis not present

## 2022-01-28 DIAGNOSIS — G8918 Other acute postprocedural pain: Secondary | ICD-10-CM | POA: Diagnosis not present

## 2022-01-28 DIAGNOSIS — S8261XA Displaced fracture of lateral malleolus of right fibula, initial encounter for closed fracture: Secondary | ICD-10-CM | POA: Diagnosis not present

## 2022-02-10 DIAGNOSIS — S8262XA Displaced fracture of lateral malleolus of left fibula, initial encounter for closed fracture: Secondary | ICD-10-CM | POA: Diagnosis not present

## 2022-02-10 DIAGNOSIS — M25572 Pain in left ankle and joints of left foot: Secondary | ICD-10-CM | POA: Diagnosis not present

## 2022-03-10 DIAGNOSIS — M25572 Pain in left ankle and joints of left foot: Secondary | ICD-10-CM | POA: Diagnosis not present

## 2022-03-17 DIAGNOSIS — M25572 Pain in left ankle and joints of left foot: Secondary | ICD-10-CM | POA: Diagnosis not present

## 2022-03-17 DIAGNOSIS — R262 Difficulty in walking, not elsewhere classified: Secondary | ICD-10-CM | POA: Diagnosis not present

## 2022-03-19 DIAGNOSIS — M25572 Pain in left ankle and joints of left foot: Secondary | ICD-10-CM | POA: Diagnosis not present

## 2022-03-19 DIAGNOSIS — R262 Difficulty in walking, not elsewhere classified: Secondary | ICD-10-CM | POA: Diagnosis not present

## 2022-03-22 DIAGNOSIS — M25572 Pain in left ankle and joints of left foot: Secondary | ICD-10-CM | POA: Diagnosis not present

## 2022-03-22 DIAGNOSIS — R262 Difficulty in walking, not elsewhere classified: Secondary | ICD-10-CM | POA: Diagnosis not present

## 2022-03-24 DIAGNOSIS — M25572 Pain in left ankle and joints of left foot: Secondary | ICD-10-CM | POA: Diagnosis not present

## 2022-03-24 DIAGNOSIS — R262 Difficulty in walking, not elsewhere classified: Secondary | ICD-10-CM | POA: Diagnosis not present

## 2022-03-30 DIAGNOSIS — R262 Difficulty in walking, not elsewhere classified: Secondary | ICD-10-CM | POA: Diagnosis not present

## 2022-03-30 DIAGNOSIS — M25572 Pain in left ankle and joints of left foot: Secondary | ICD-10-CM | POA: Diagnosis not present

## 2022-03-31 DIAGNOSIS — M25572 Pain in left ankle and joints of left foot: Secondary | ICD-10-CM | POA: Diagnosis not present

## 2022-03-31 DIAGNOSIS — R262 Difficulty in walking, not elsewhere classified: Secondary | ICD-10-CM | POA: Diagnosis not present

## 2022-04-01 DIAGNOSIS — M25572 Pain in left ankle and joints of left foot: Secondary | ICD-10-CM | POA: Diagnosis not present

## 2022-04-01 DIAGNOSIS — R262 Difficulty in walking, not elsewhere classified: Secondary | ICD-10-CM | POA: Diagnosis not present

## 2022-04-05 DIAGNOSIS — R262 Difficulty in walking, not elsewhere classified: Secondary | ICD-10-CM | POA: Diagnosis not present

## 2022-04-05 DIAGNOSIS — M25572 Pain in left ankle and joints of left foot: Secondary | ICD-10-CM | POA: Diagnosis not present

## 2022-04-07 DIAGNOSIS — M25572 Pain in left ankle and joints of left foot: Secondary | ICD-10-CM | POA: Diagnosis not present

## 2022-04-07 DIAGNOSIS — R262 Difficulty in walking, not elsewhere classified: Secondary | ICD-10-CM | POA: Diagnosis not present

## 2022-04-09 DIAGNOSIS — R262 Difficulty in walking, not elsewhere classified: Secondary | ICD-10-CM | POA: Diagnosis not present

## 2022-04-09 DIAGNOSIS — M25572 Pain in left ankle and joints of left foot: Secondary | ICD-10-CM | POA: Diagnosis not present

## 2022-04-12 DIAGNOSIS — R262 Difficulty in walking, not elsewhere classified: Secondary | ICD-10-CM | POA: Diagnosis not present

## 2022-04-12 DIAGNOSIS — M25572 Pain in left ankle and joints of left foot: Secondary | ICD-10-CM | POA: Diagnosis not present

## 2022-04-21 DIAGNOSIS — M25572 Pain in left ankle and joints of left foot: Secondary | ICD-10-CM | POA: Diagnosis not present

## 2022-06-02 DIAGNOSIS — K219 Gastro-esophageal reflux disease without esophagitis: Secondary | ICD-10-CM | POA: Diagnosis not present

## 2022-06-02 DIAGNOSIS — Z01818 Encounter for other preprocedural examination: Secondary | ICD-10-CM | POA: Diagnosis not present

## 2022-06-18 DIAGNOSIS — Z1211 Encounter for screening for malignant neoplasm of colon: Secondary | ICD-10-CM | POA: Diagnosis not present

## 2022-06-18 DIAGNOSIS — Z880 Allergy status to penicillin: Secondary | ICD-10-CM | POA: Diagnosis not present

## 2022-06-18 DIAGNOSIS — Z881 Allergy status to other antibiotic agents status: Secondary | ICD-10-CM | POA: Diagnosis not present

## 2022-06-18 LAB — HM COLONOSCOPY

## 2022-06-22 ENCOUNTER — Other Ambulatory Visit: Payer: Self-pay

## 2022-06-22 DIAGNOSIS — Z1231 Encounter for screening mammogram for malignant neoplasm of breast: Secondary | ICD-10-CM

## 2022-07-28 ENCOUNTER — Encounter: Payer: Managed Care, Other (non HMO) | Admitting: Physician Assistant

## 2022-07-28 ENCOUNTER — Encounter: Payer: Managed Care, Other (non HMO) | Admitting: Nurse Practitioner

## 2022-08-11 ENCOUNTER — Encounter: Payer: Self-pay | Admitting: Physician Assistant

## 2022-08-11 ENCOUNTER — Ambulatory Visit
Admission: RE | Admit: 2022-08-11 | Discharge: 2022-08-11 | Disposition: A | Discharge status: Home / Self Care | Payer: 59 | Source: Ambulatory Visit

## 2022-08-11 ENCOUNTER — Ambulatory Visit (INDEPENDENT_AMBULATORY_CARE_PROVIDER_SITE_OTHER): Payer: 59 | Admitting: Physician Assistant

## 2022-08-11 VITALS — BP 124/68 | HR 71 | Temp 97.8°F | Ht 64.0 in | Wt 169.0 lb

## 2022-08-11 DIAGNOSIS — Z0000 Encounter for general adult medical examination without abnormal findings: Secondary | ICD-10-CM

## 2022-08-11 DIAGNOSIS — L989 Disorder of the skin and subcutaneous tissue, unspecified: Secondary | ICD-10-CM

## 2022-08-11 DIAGNOSIS — Z1231 Encounter for screening mammogram for malignant neoplasm of breast: Secondary | ICD-10-CM | POA: Diagnosis not present

## 2022-08-11 LAB — POCT URINALYSIS DIPSTICK
Bilirubin, UA: NEGATIVE
Glucose, UA: NEGATIVE
Ketones, UA: NEGATIVE
Leukocytes, UA: NEGATIVE
Nitrite, UA: NEGATIVE
Protein, UA: NEGATIVE
Spec Grav, UA: 1.01 (ref 1.010–1.025)
Urobilinogen, UA: NEGATIVE E.U./dL — AB
pH, UA: 7 (ref 5.0–8.0)

## 2022-08-11 NOTE — Progress Notes (Signed)
Subjective:  Patient ID: Monique Riggs, female    DOB: [birth year]  Age: 57 y.o. MRN: [id number]  Chief Complaint  Patient presents with   Annual Exam    HPI Well Adult Physical: Patient here for a comprehensive physical exam.The patient reports  has growth on right 3rd fingertip - enlarging and nailbed irregular - unsure how long has been there Do you take any herbs or supplements that were not prescribed by a doctor? no Are you taking calcium supplements? no Are you taking aspirin daily? no  Encounter for general adult medical examination without abnormal findings  Physical ("At Risk" items are starred): Patient's last physical exam was 1 year ago .  Patient is not afflicted from Stress Incontinence and Urge Incontinence  Patient wears a seat belts Patient has smoke detectors and has carbon monoxide detectors. Patient wears sunscreen with extended sun exposure. Dental Care: biannual cleanings, brushes and flosses daily. Ophthalmology/Optometry: Annual visit.  Hearing loss: none Vision impairments: contacts  Pregnancy history: G2P2 Safe at home: yes Self breast exams: yes     08/11/2022    2:53 PM 07/22/2021   10:29 AM 07/15/2020    2:03 PM  Depression screen PHQ 2/9  Decreased Interest 0 0 0  Down, Depressed, Hopeless 0 0 0  PHQ - 2 Score 0 0 0         07/15/2020    1:57 PM 07/22/2021   10:29 AM 08/11/2022    2:54 PM  Fall Risk  Falls in the past year? 0 0 0  Was there an injury with Fall? 0 0 0  Fall Risk Category Calculator 0 0 0  Fall Risk Category (Retired) Low Low   (RETIRED) Patient Fall Risk Level Low fall risk Low fall risk   Patient at Risk for Falls Due to  No Fall Risks No Fall Risks  Fall risk Follow up Falls evaluation completed Falls evaluation completed Falls evaluation completed             Social Hx   Social History   Socioeconomic History   Marital status: Married    Spouse name: Kalei Hawksley   Number of children: 2   Years of education:  Not on file   Highest education level: Bachelor's degree (e.g., BA, AB, BS)  Occupational History   Occupation: Associate Professor  Tobacco Use   Smoking status: Never   Smokeless tobacco: Never  Vaping Use   Vaping Use: Never used  Substance and Sexual Activity   Alcohol use: Not Currently   Drug use: Never   Sexual activity: Yes    Partners: Male  Other Topics Concern   Not on file  Social History Narrative   Not on file   Social Determinants of Health   Financial Resource Strain: Low Risk  (08/11/2022)   Overall Financial Resource Strain (CARDIA)    Difficulty of Paying Living Expenses: Not hard at all  Food Insecurity: No Food Insecurity (08/07/2022)   Hunger Vital Sign    Worried About Running Out of Food in the Last Year: Never true    Ran Out of Food in the Last Year: Never true  Transportation Needs: No Transportation Needs (08/07/2022)   PRAPARE - Administrator, Civil Service (Medical): No    Lack of Transportation (Non-Medical): No  Physical Activity: Insufficiently Active (08/07/2022)   Exercise Vital Sign    Days of Exercise per Week: 2 days    Minutes of Exercise per Session: 20  min  Stress: No Stress Concern Present (08/07/2022)   [employer] - Occupational Stress Questionnaire    Feeling of Stress : Only a little  Social Connections: Moderately Integrated (08/07/2022)   Social Connection and Isolation Panel [NHANES]    Frequency of Communication with Friends and Family: Twice a week    Frequency of Social Gatherings with Friends and Family: Once a week    Attends Religious Services: More than 4 times per year    Active Member of [employer] FinancialClubs or Organizations: No    Attends Engineer, structuralClub or Organization Meetings: Not on file    Marital Status: Married   History reviewed. No pertinent past medical history. Past Surgical History:  Procedure Laterality Date   CESAREAN SECTION     2    Family History  Problem Relation Age of Onset   Prostate  cancer Father    Obesity Sister    Breast cancer Paternal Grandmother     Review of Systems CONSTITUTIONAL: Negative for chills, fatigue, fever, unintentional weight gain and unintentional weight loss.  E/N/T: Negative for ear pain, nasal congestion and sore throat.  CARDIOVASCULAR: Negative for chest pain, dizziness, palpitations and pedal edema.  RESPIRATORY: Negative for recent cough and dyspnea.  GASTROINTESTINAL: Negative for abdominal pain, acid reflux symptoms, constipation, diarrhea, nausea and vomiting.  MSK: Negative for arthralgias and myalgias.  INTEGUMENTARY: see HPI NEUROLOGICAL: Negative for dizziness and headaches.  PSYCHIATRIC: Negative for sleep disturbance and to question depression screen.  Negative for depression, negative for anhedonia.       Objective:  PHYSICAL EXAM:  Vision Screening   Right eye Left eye Both eyes  Without correction     With correction 20/25 20/25 20/20     VS: BP 124/68 (BP Location: Left Arm, Patient Position: Sitting)   Pulse 71   Temp 97.8 F (36.6 C) (Temporal)   Ht 5\' 4"  (1.626 m)   Wt 169 lb (76.7 kg)   LMP 06/05/2017   SpO2 98%   BMI 29.01 kg/m   GEN: Well nourished, well developed, in no acute distress  HEENT: normal external ears and nose - normal external auditory canals and TMS -- Lips, Teeth and Gums - normal  Oropharynx - normal mucosa, palate, and posterior pharynx Neck: no JVD or masses - no thyromegaly Cardiac: RRR; no murmurs, rubs, or gallops,no edema -  Respiratory:  normal respiratory rate and pattern with no distress - normal breath sounds with no rales, rhonchi, wheezes or rubs GI: normal bowel sounds, no masses or tenderness MS: no deformity or atrophy  Skin: warm and dry, no rash  Neuro:  Alert and Oriented x 3, Strength and sensation are intact - CN II-Xii grossly intact Psych: euthymic mood, appropriate affect and demeanor  Office Visit on 08/11/2022  Component Date Value Ref Range Status    Glucose, UA 08/11/2022 Negative  Negative Final   Bilirubin, UA 08/11/2022 negative   Final   Ketones, UA 08/11/2022 negative   Final   Spec Grav, UA 08/11/2022 1.010  1.010 - 1.025 Final   Blood, UA 08/11/2022 1+   Final   pH, UA 08/11/2022 7.0  5.0 - 8.0 Final   Protein, UA 08/11/2022 Negative  Negative Final   Urobilinogen, UA 08/11/2022 negative (A)  0.2 or 1.0 E.U./dL Final   Nitrite, UA 16/10/960404/02/2023 negative   Final   Leukocytes, UA 08/11/2022 Negative  Negative Final    Lab Results  Component Value Date   WBC 8.1 07/22/2021  HGB 15.1 07/22/2021   HCT 45.2 07/22/2021   PLT 264 07/22/2021   GLUCOSE 85 07/22/2021   CHOL 164 07/22/2021   TRIG 77 07/22/2021   HDL 59 07/22/2021   LDLCALC 90 07/22/2021   ALT 13 07/22/2021   AST 14 07/22/2021   NA 146 (H) 07/22/2021   K 4.5 07/22/2021   CL 104 07/22/2021   CREATININE 0.74 07/22/2021   BUN 26 (H) 07/22/2021   CO2 25 07/22/2021   TSH 1.810 07/22/2021      Assessment & Plan:  Encounter for general adult medical examination without abnormal findings -     POCT urinalysis dipstick -     CBC with Differential/Platelet; Future -     Comprehensive metabolic panel; Future -     TSH; Future -     Lipid panel; Future  Lesion of finger -     Ambulatory referral to Dermatology     This is a list of the screening recommended for you and due dates:  Health Maintenance  Topic Date Due   Flu Shot  12/02/2022   Mammogram  07/30/2023   Pap Smear  09/25/2024   DTaP/Tdap/Td vaccine (2 - Td or Tdap) 01/23/2031   Colon Cancer Screening  06/18/2032   Zoster (Shingles) Vaccine  Completed   HPV Vaccine  Aged Out   COVID-19 Vaccine  Discontinued   Hepatitis C Screening: USPSTF Recommendation to screen - Ages 64-79 yo.  Discontinued   HIV Screening  Discontinued     No orders of the defined types were placed in this encounter.   Follow-up: Return in about 1 year (around 08/11/2023) for fasting physical.  An After Visit Summary  was printed and given to the patient.  Jettie Pagan [facility] [phone]

## 2022-08-17 ENCOUNTER — Other Ambulatory Visit: Payer: 59

## 2022-08-17 DIAGNOSIS — Z0000 Encounter for general adult medical examination without abnormal findings: Secondary | ICD-10-CM | POA: Diagnosis not present

## 2022-08-18 LAB — COMPREHENSIVE METABOLIC PANEL
ALT: 15 IU/L (ref 0–32)
AST: 17 IU/L (ref 0–40)
Albumin/Globulin Ratio: 2.6 — ABNORMAL HIGH (ref 1.2–2.2)
Albumin: 4.7 g/dL (ref 3.8–4.9)
Alkaline Phosphatase: 97 IU/L (ref 44–121)
BUN/Creatinine Ratio: 21 (ref 9–23)
BUN: 16 mg/dL (ref 6–24)
Bilirubin Total: 0.5 mg/dL (ref 0.0–1.2)
CO2: 24 mmol/L (ref 20–29)
Calcium: 9.7 mg/dL (ref 8.7–10.2)
Chloride: 105 mmol/L (ref 96–106)
Creatinine, Ser: 0.75 mg/dL (ref 0.57–1.00)
Globulin, Total: 1.8 g/dL (ref 1.5–4.5)
Glucose: 86 mg/dL (ref 70–99)
Potassium: 4.6 mmol/L (ref 3.5–5.2)
Sodium: 143 mmol/L (ref 134–144)
Total Protein: 6.5 g/dL (ref 6.0–8.5)
eGFR: 95 mL/min/{1.73_m2} (ref >59)

## 2022-08-18 LAB — CBC WITH DIFFERENTIAL/PLATELET
Basophils Absolute: 0 10*3/uL (ref 0.0–0.2)
Basos: 1 %
EOS (ABSOLUTE): 0.1 10*3/uL (ref 0.0–0.4)
Eos: 2 %
Hematocrit: 44.8 % (ref 34.0–46.6)
Hemoglobin: 14.7 g/dL (ref 11.1–15.9)
Immature Grans (Abs): 0 10*3/uL (ref 0.0–0.1)
Immature Granulocytes: 0 %
Lymphocytes Absolute: 1.3 10*3/uL (ref 0.7–3.1)
Lymphs: 21 %
MCH: 30.7 pg (ref 26.6–33.0)
MCHC: 32.8 g/dL (ref 31.5–35.7)
MCV: 94 fL (ref 79–97)
Monocytes Absolute: 0.4 10*3/uL (ref 0.1–0.9)
Monocytes: 6 %
Neutrophils Absolute: 4.1 10*3/uL (ref 1.4–7.0)
Neutrophils: 70 %
Platelets: 243 10*3/uL (ref 150–450)
RBC: 4.79 x10E6/uL (ref 3.77–5.28)
RDW: 12.7 % (ref 11.7–15.4)
WBC: 5.9 10*3/uL (ref 3.4–10.8)

## 2022-08-18 LAB — LIPID PANEL
Chol/HDL Ratio: 3.2 ratio (ref 0.0–4.4)
Cholesterol, Total: 162 mg/dL (ref 100–199)
HDL: 51 mg/dL (ref >39)
LDL Chol Calc (NIH): 94 mg/dL (ref 0–99)
Triglycerides: 89 mg/dL (ref 0–149)
VLDL Cholesterol Cal: 17 mg/dL (ref 5–40)

## 2022-08-18 LAB — CARDIOVASCULAR RISK ASSESSMENT

## 2022-08-18 LAB — TSH: TSH: 2.89 u[IU]/mL (ref 0.450–4.500)

## 2022-08-25 DIAGNOSIS — B079 Viral wart, unspecified: Secondary | ICD-10-CM | POA: Diagnosis not present

## 2022-08-25 DIAGNOSIS — D485 Neoplasm of uncertain behavior of skin: Secondary | ICD-10-CM | POA: Diagnosis not present

## 2022-08-28 ENCOUNTER — Encounter: Payer: Managed Care, Other (non HMO) | Admitting: Nurse Practitioner

## 2022-09-21 DIAGNOSIS — M67449 Ganglion, unspecified hand: Secondary | ICD-10-CM | POA: Diagnosis not present

## 2023-04-01 IMAGING — MG MM DIGITAL SCREENING BILAT W/ TOMO AND CAD
8 series · 8 of 24 positions shown · non-contrast
Comparison: Previous exam(s).

CLINICAL DATA: Screening.

EXAM:
DIGITAL SCREENING BILATERAL MAMMOGRAM WITH TOMOSYNTHESIS AND CAD
TECHNIQUE: Bilateral screening digital craniocaudal and mediolateral oblique
mammograms were obtained. Bilateral screening digital breast
tomosynthesis was performed. The images were evaluated with
computer-aided detection.

[L MLO synth-2D]
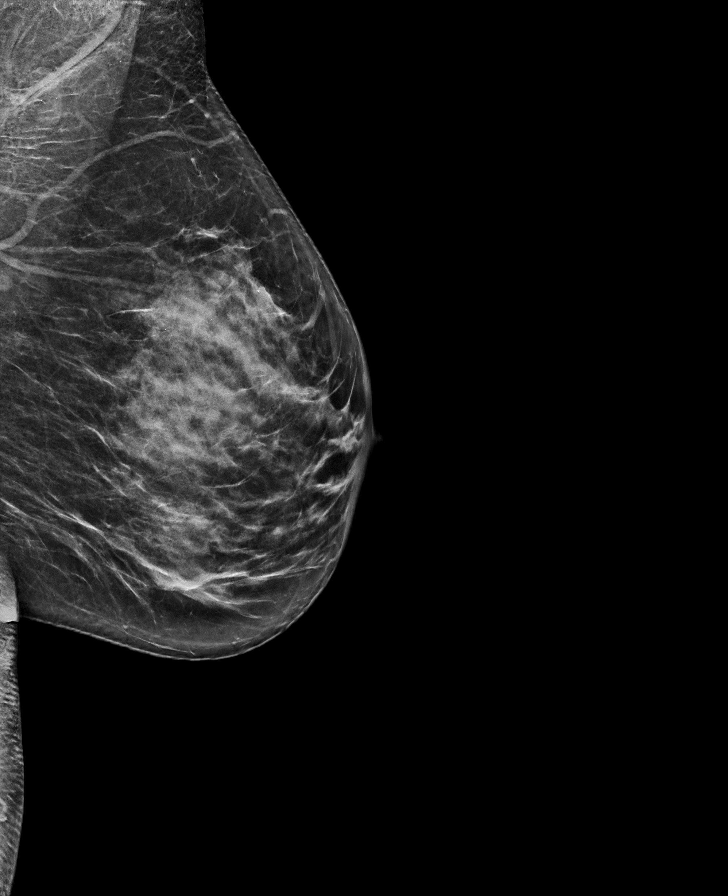

[R MLO synth-2D]
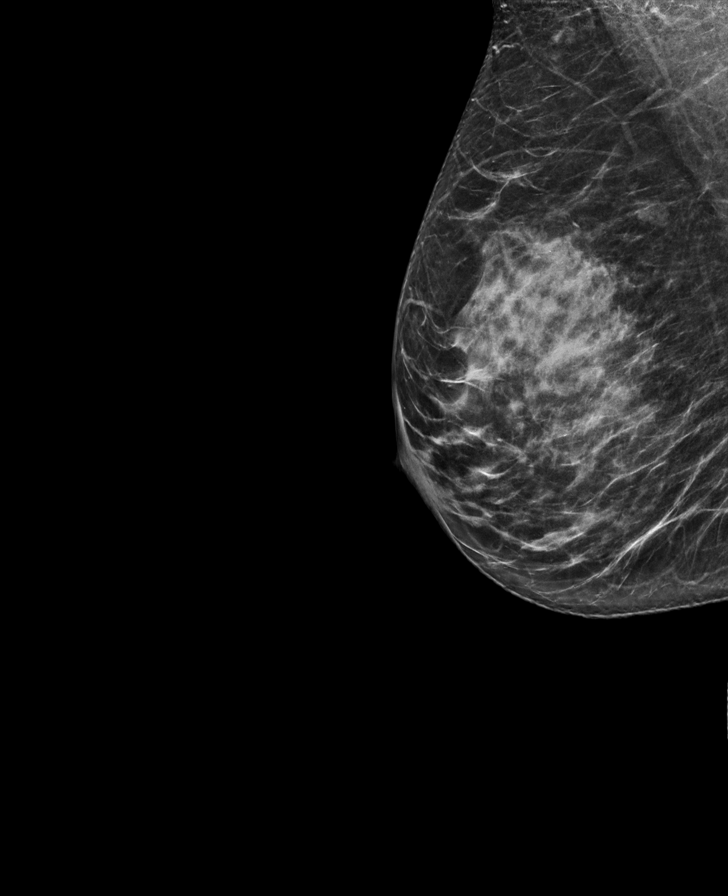

[R CC synth-2D]
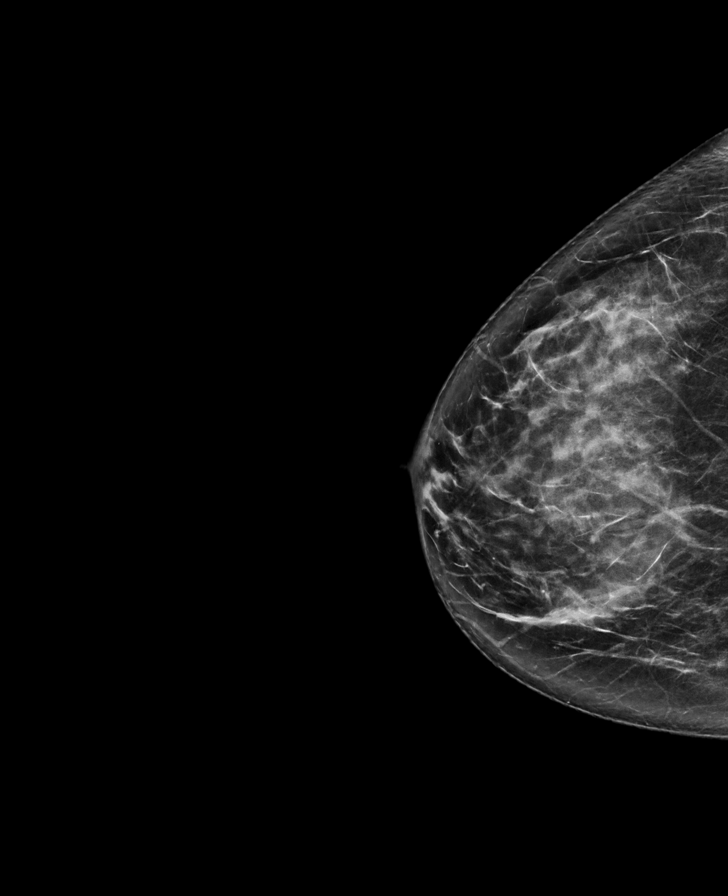

[L CC synth-2D]
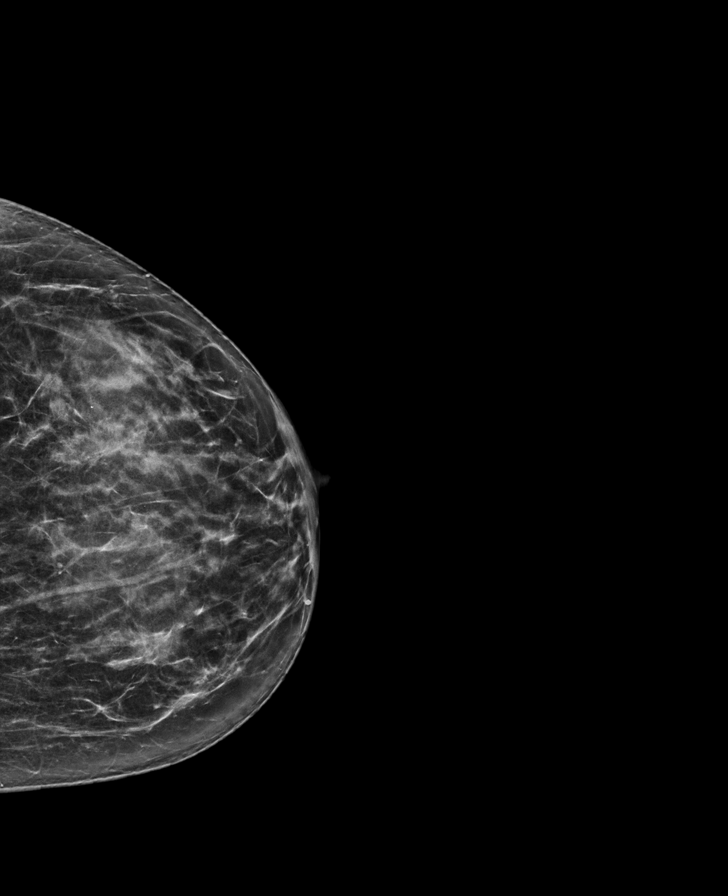

[L CC tomo · tomo slice 31/62.0]
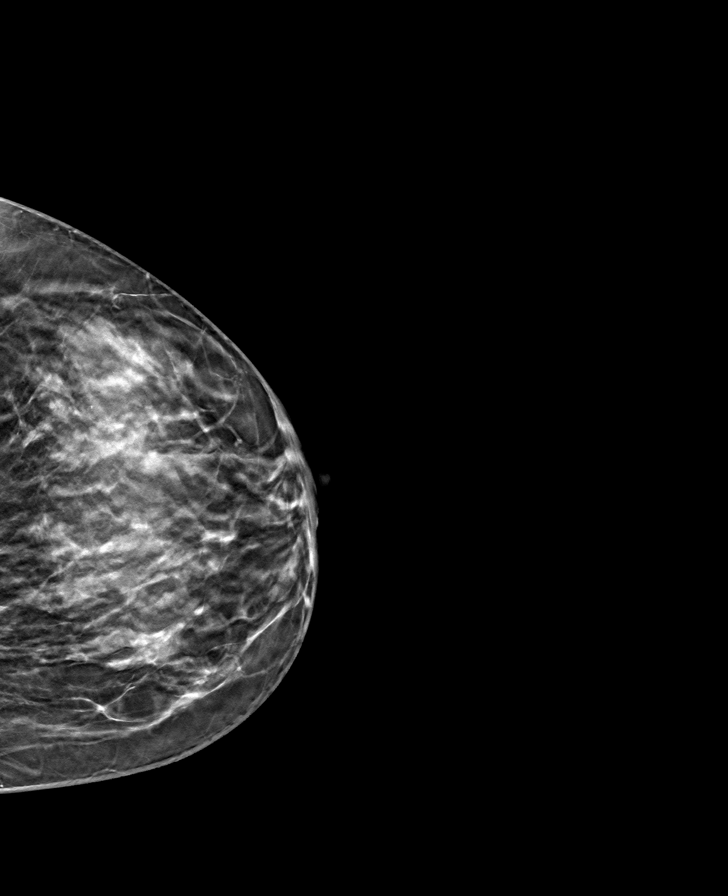

[R MLO tomo · tomo slice 34/67.0]
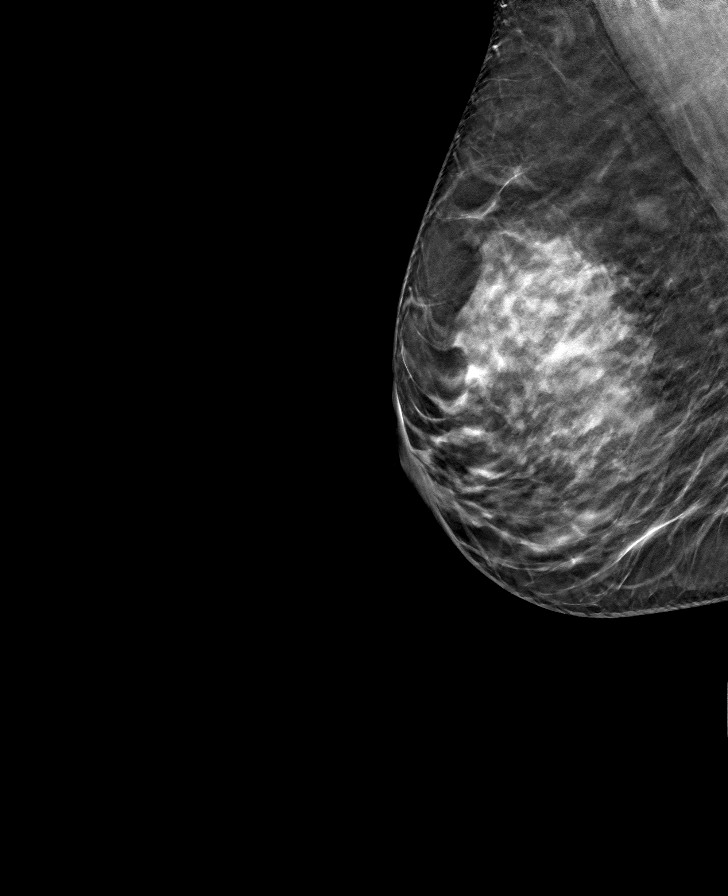

[R CC tomo · tomo slice 35/70.0]
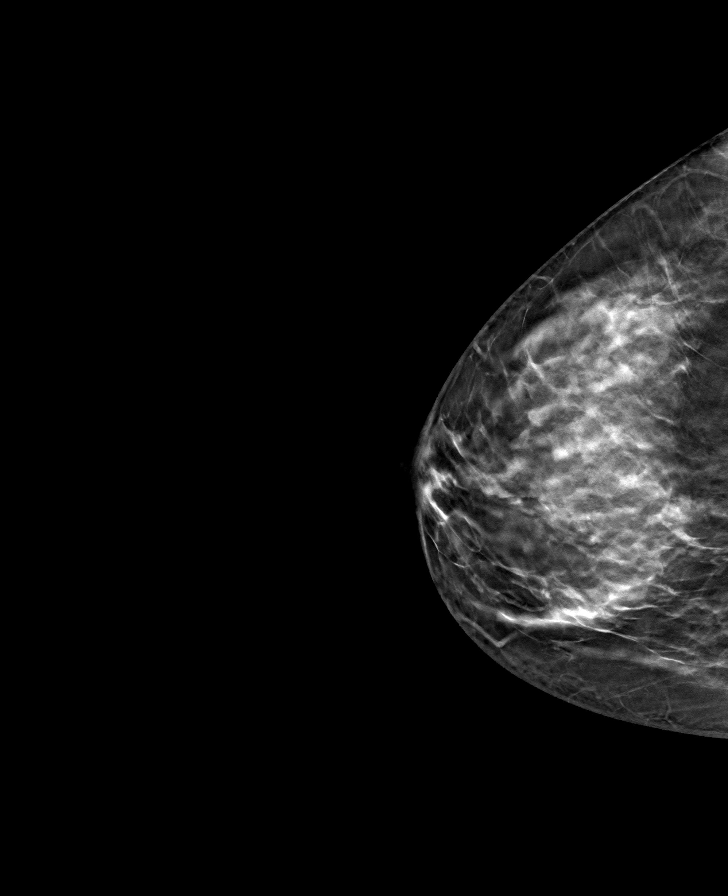

[L MLO tomo · tomo slice 35/68.0]
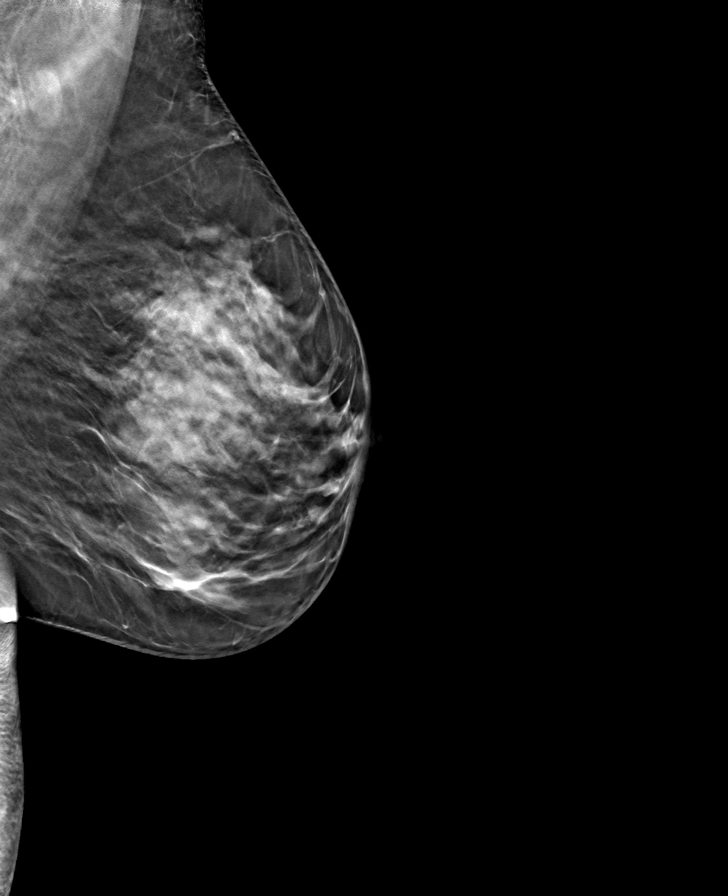

[8 of 24 positions shown; findings below may reference images not displayed]

ACR Breast Density Category c: The breast tissue is heterogeneously
dense, which may obscure small masses.
FINDINGS: There are no findings suspicious for malignancy.
IMPRESSION: No mammographic evidence of malignancy. A result letter of this
screening mammogram will be mailed directly to the patient.

RECOMMENDATION:
Screening mammogram in one year. (Code:Q3-W-BC3)

BI-RADS CATEGORY  1: Negative.

## 2023-07-25 ENCOUNTER — Encounter: Payer: 59 | Admitting: Physician Assistant

## 2023-08-16 ENCOUNTER — Encounter: Payer: 59 | Admitting: Physician Assistant

## 2023-08-25 ENCOUNTER — Ambulatory Visit (INDEPENDENT_AMBULATORY_CARE_PROVIDER_SITE_OTHER): Payer: Self-pay | Admitting: Physician Assistant

## 2023-08-25 ENCOUNTER — Encounter: Payer: Self-pay | Admitting: Physician Assistant

## 2023-08-25 VITALS — BP 100/70 | HR 72 | Temp 97.8°F | Resp 18 | Ht 64.0 in | Wt 176.2 lb

## 2023-08-25 DIAGNOSIS — G2581 Restless legs syndrome: Secondary | ICD-10-CM | POA: Diagnosis not present

## 2023-08-25 DIAGNOSIS — Z0000 Encounter for general adult medical examination without abnormal findings: Secondary | ICD-10-CM | POA: Diagnosis not present

## 2023-08-25 DIAGNOSIS — Z1231 Encounter for screening mammogram for malignant neoplasm of breast: Secondary | ICD-10-CM | POA: Diagnosis not present

## 2023-08-25 DIAGNOSIS — R3121 Asymptomatic microscopic hematuria: Secondary | ICD-10-CM

## 2023-08-25 LAB — POCT URINALYSIS DIP (CLINITEK)
Bilirubin, UA: NEGATIVE
Glucose, UA: NEGATIVE mg/dL
Ketones, POC UA: NEGATIVE mg/dL
Leukocytes, UA: NEGATIVE
Nitrite, UA: NEGATIVE
POC PROTEIN,UA: NEGATIVE
Spec Grav, UA: 1.015 (ref 1.010–1.025)
Urobilinogen, UA: 0.2 U/dL
pH, UA: 6 (ref 5.0–8.0)

## 2023-08-25 NOTE — Progress Notes (Signed)
 Subjective:  Patient ID: Monique Riggs, female    DOB: [birth year]  Age: 58 y.o. MRN: [id number]  Chief Complaint  Patient presents with   Annual Exam    HPI Well Adult Physical: Patient here for a comprehensive physical exam.The patient reports  she has had some trouble with restless leg issues a few times per week - interested in trying magnesium first before doing prescription Do you take any herbs or supplements that were not prescribed by a doctor? yes Are you taking calcium supplements? no Are you taking aspirin daily? no  Encounter for general adult medical examination without abnormal findings  Physical ("At Risk" items are starred): Patient's last physical exam was 1 year ago .  Patient is not afflicted from Stress Incontinence and Urge Incontinence  Patient wears a seat belts Patient has smoke detectors and has carbon monoxide detectors. Patient practices appropriate gun safety. Patient wears sunscreen with extended sun exposure. Dental Care: brushes and flosses daily. Last dental visit: up to date Vision impairments: none Ophthalmology/Optometry: Annual visit.  Hearing loss: none  Patient's last menstrual period was 06/05/2017.  Safe at home: Yes Self breast exams: Yes Last pap: up to date Last mammogram: is due     08/25/2023    2:37 PM 08/11/2022    2:53 PM 07/22/2021   10:29 AM 07/15/2020    2:03 PM  Depression screen PHQ 2/9  Decreased Interest 0 0 0 0  Down, Depressed, Hopeless 0 0 0 0  PHQ - 2 Score 0 0 0 0         07/15/2020    1:57 PM 07/22/2021   10:29 AM 08/11/2022    2:54 PM 08/25/2023    2:37 PM  Fall Risk  Falls in the past year? 0 0 0 0  Was there an injury with Fall? 0 0 0 0  Fall Risk Category Calculator 0 0 0 0  Fall Risk Category (Retired) Low Low    (RETIRED) Patient Fall Risk Level Low fall risk Low fall risk    Patient at Risk for Falls Due to  No Fall Risks No Fall Risks No Fall Risks  Fall risk Follow up Falls evaluation completed  Falls evaluation completed Falls evaluation completed Falls evaluation completed             Social Hx   Social History   Socioeconomic History   Marital status: Married    Spouse name: Shanesha Bednarz   Number of children: 2   Years of education: Not on file   Highest education level: Bachelor's degree (e.g., BA, AB, BS)  Occupational History   Occupation: Associate Professor  Tobacco Use   Smoking status: Never   Smokeless tobacco: Never  Vaping Use   Vaping status: Never Used  Substance and Sexual Activity   Alcohol use: Never   Drug use: Never   Sexual activity: Yes    Partners: Male    Birth control/protection: Post-menopausal  Other Topics Concern   Not on file  Social History Narrative   Not on file   Social Drivers of Health   Financial Resource Strain: Low Risk  (08/18/2023)   Overall Financial Resource Strain (CARDIA)    Difficulty of Paying Living Expenses: Not hard at all  Food Insecurity: No Food Insecurity (08/18/2023)   Hunger Vital Sign    Worried About Running Out of Food in the Last Year: Never true    Ran Out of Food in the Last Year: Never true  Transportation Needs: No Transportation Needs (08/18/2023)   PRAPARE - Administrator, Civil Service (Medical): No    Lack of Transportation (Non-Medical): No  Physical Activity: Insufficiently Active (08/18/2023)   Exercise Vital Sign    Days of Exercise per Week: 3 days    Minutes of Exercise per Session: 30 min  Stress: No Stress Concern Present (08/18/2023)   [employer] of Occupational Health - Occupational Stress Questionnaire    Feeling of Stress : Only a little  Social Connections: Moderately Integrated (08/18/2023)   Social Connection and Isolation Panel [NHANES]    Frequency of Communication with Friends and Family: More than three times a week    Frequency of Social Gatherings with Friends and Family: Twice a week    Attends Religious Services: More than 4 times per year    Active  Member of [employer] or Organizations: No    Attends Engineer, structural: Not on file    Marital Status: Married   History reviewed. No pertinent past medical history. Past Surgical History:  Procedure Laterality Date   CESAREAN SECTION     2   FRACTURE SURGERY  01/28/2022   Broken ankle   TUBAL LIGATION  02/02/2001    Family History  Problem Relation Age of Onset   Prostate cancer Father    Stroke Father    Obesity Sister    Breast cancer Paternal Grandmother     ROS CONSTITUTIONAL: Negative for chills, fatigue, fever, unintentional weight gain and unintentional weight loss.  E/N/T: Negative for ear pain, nasal congestion and sore throat.  CARDIOVASCULAR: Negative for chest pain, dizziness, palpitations and pedal edema.  RESPIRATORY: Negative for recent cough and dyspnea.  GASTROINTESTINAL: Negative for abdominal pain, acid reflux symptoms, constipation, diarrhea, nausea and vomiting.  GU - negative - no dysuria or hematuria - no vaginal symptoms MSK: Negative for arthralgias and myalgias.- some restless leg symptoms at night  INTEGUMENTARY: Negative for rash.  NEUROLOGICAL: Negative for dizziness and headaches.  PSYCHIATRIC: Negative for sleep disturbance and to question depression screen.  Negative for depression, negative for anhedonia.   Objective:  PHYSICAL EXAM:   BP 100/70   Pulse 72   Temp 97.8 F (36.6 C) (Temporal)   Resp 18   Ht 5\' 4"  (1.626 m)   Wt 176 lb 3.2 oz (79.9 kg)   LMP 06/05/2017   SpO2 98%   BMI 30.24 kg/m   Vision Screening   Right eye Left eye Both eyes  Without correction     With correction 20/25 20/30 20/20     GEN: Well nourished, well developed, in no acute distress  HEENT: normal external ears and nose - normal external auditory canals and TMS - hearing grossly normal - - Lips, Teeth and Gums - normal  Oropharynx - normal mucosa, palate, and posterior pharynx Neck: no JVD or masses - no thyromegaly Cardiac: RRR; no murmurs,  rubs, or gallops,no edema -  Respiratory:  normal respiratory rate and pattern with no distress - normal breath sounds with no rales, rhonchi, wheezes or rubs GI: normal bowel sounds, no masses or tenderness MS: no deformity or atrophy  Skin: warm and dry, no rash  Neuro:  Alert and Oriented x 3, Strength and sensation are intact - CN II-Xii grossly intact Psych: euthymic mood, appropriate affect and demeanor  Office Visit on 08/25/2023  Component Date Value Ref Range Status   Color, UA 08/25/2023 yellow  yellow Final   Clarity, UA 08/25/2023 clear  clear Final   Glucose, UA 08/25/2023 negative  negative mg/dL Final   Bilirubin, UA 16/02/9603 negative  negative Final   Ketones, POC UA 08/25/2023 negative  negative mg/dL Final   Spec Grav, UA 54/01/8118 1.015  1.010 - 1.025 Final   Blood, UA 08/25/2023 moderate (A)  negative Final   pH, UA 08/25/2023 6.0  5.0 - 8.0 Final   POC PROTEIN,UA 08/25/2023 negative  negative, trace Final   Urobilinogen, UA 08/25/2023 0.2  0.2 or 1.0 E.U./dL Final   Nitrite, UA 14/78/2956 Negative  Negative Final   Leukocytes, UA 08/25/2023 Negative  Negative Final    Assessment & Plan:  Encounter for general adult medical examination without abnormal findings -     POCT URINALYSIS DIP (CLINITEK) -     CBC with Differential/Platelet; Future -     Comprehensive metabolic panel with GFR; Future -     TSH; Future -     Lipid panel; Future -     B12 and Folate Panel; Future -     VITAMIN D 25 Hydroxy (Vit-D Deficiency, Fractures); Future -     Magnesium; Future  Asymptomatic microscopic hematuria -     POCT URINALYSIS DIP (CLINITEK) -     Urine Culture  Restless leg -     Comprehensive metabolic panel with GFR; Future -     B12 and Folate Panel; Future -     VITAMIN D 25 Hydroxy (Vit-D Deficiency, Fractures); Future -     Magnesium; Future  Encounter for screening mammogram for breast cancer -     3D Screening Mammogram, Left and Right;  Future    This is a list of the screening recommended for you and due dates:  Health Maintenance  Topic Date Due   Mammogram  08/11/2023   Flu Shot  12/02/2023   Pap with HPV screening  09/25/2024   DTaP/Tdap/Td vaccine (2 - Td or Tdap) 01/23/2031   Colon Cancer Screening  06/18/2032   Zoster (Shingles) Vaccine  Completed   HPV Vaccine  Aged Out   Meningitis B Vaccine  Aged Out   COVID-19 Vaccine  Discontinued   Hepatitis C Screening  Discontinued   HIV Screening  Discontinued     Follow-up: Return in about 1 year (around 08/24/2024) for fasting physical - 20 min -- next week for labwork and repeat ua.  An After Visit Summary was printed and given to the patient.  Anthonette Bastos [facility] [phone]

## 2023-08-26 LAB — URINE CULTURE

## 2023-08-29 ENCOUNTER — Encounter: Payer: Self-pay | Admitting: Physician Assistant

## 2023-08-31 ENCOUNTER — Other Ambulatory Visit (INDEPENDENT_AMBULATORY_CARE_PROVIDER_SITE_OTHER)

## 2023-08-31 ENCOUNTER — Other Ambulatory Visit: Payer: Self-pay | Admitting: Physician Assistant

## 2023-08-31 ENCOUNTER — Encounter: Payer: Self-pay | Admitting: Physician Assistant

## 2023-08-31 DIAGNOSIS — G2581 Restless legs syndrome: Secondary | ICD-10-CM

## 2023-08-31 DIAGNOSIS — R319 Hematuria, unspecified: Secondary | ICD-10-CM | POA: Diagnosis not present

## 2023-08-31 DIAGNOSIS — R3121 Asymptomatic microscopic hematuria: Secondary | ICD-10-CM

## 2023-08-31 DIAGNOSIS — Z0000 Encounter for general adult medical examination without abnormal findings: Secondary | ICD-10-CM

## 2023-08-31 LAB — POCT URINALYSIS DIP (CLINITEK)
Bilirubin, UA: NEGATIVE
Glucose, UA: NEGATIVE mg/dL
Ketones, POC UA: NEGATIVE mg/dL
Leukocytes, UA: NEGATIVE
Nitrite, UA: NEGATIVE
POC PROTEIN,UA: NEGATIVE
Spec Grav, UA: 1.01 (ref 1.010–1.025)
Urobilinogen, UA: 0.2 U/dL
pH, UA: 7 (ref 5.0–8.0)

## 2023-08-31 NOTE — Progress Notes (Signed)
 Patient is in office today for a nurse visit for Repeat UA. Patient UA was  positive for RBCs  Provider made aware of UA results.

## 2023-09-01 LAB — CBC WITH DIFFERENTIAL/PLATELET
Basophils Absolute: 0.1 10*3/uL (ref 0.0–0.2)
Basos: 1 %
EOS (ABSOLUTE): 0.1 10*3/uL (ref 0.0–0.4)
Eos: 3 %
Hematocrit: 44.5 % (ref 34.0–46.6)
Hemoglobin: 14.6 g/dL (ref 11.1–15.9)
Immature Grans (Abs): 0 10*3/uL (ref 0.0–0.1)
Immature Granulocytes: 0 %
Lymphocytes Absolute: 1.3 10*3/uL (ref 0.7–3.1)
Lymphs: 26 %
MCH: 30.7 pg (ref 26.6–33.0)
MCHC: 32.8 g/dL (ref 31.5–35.7)
MCV: 94 fL (ref 79–97)
Monocytes Absolute: 0.4 10*3/uL (ref 0.1–0.9)
Monocytes: 9 %
Neutrophils Absolute: 3 10*3/uL (ref 1.4–7.0)
Neutrophils: 61 %
Platelets: 246 10*3/uL (ref 150–450)
RBC: 4.75 x10E6/uL (ref 3.77–5.28)
RDW: 12.7 % (ref 11.7–15.4)
WBC: 4.9 10*3/uL (ref 3.4–10.8)

## 2023-09-01 LAB — MAGNESIUM: Magnesium: 2.4 mg/dL — ABNORMAL HIGH (ref 1.6–2.3)

## 2023-09-01 LAB — COMPREHENSIVE METABOLIC PANEL WITH GFR
ALT: 20 IU/L (ref 0–32)
AST: 22 IU/L (ref 0–40)
Albumin: 4.8 g/dL (ref 3.8–4.9)
Alkaline Phosphatase: 104 IU/L (ref 44–121)
BUN/Creatinine Ratio: 33 — ABNORMAL HIGH (ref 9–23)
BUN: 23 mg/dL (ref 6–24)
Bilirubin Total: 0.4 mg/dL (ref 0.0–1.2)
CO2: 24 mmol/L (ref 20–29)
Calcium: 9.9 mg/dL (ref 8.7–10.2)
Chloride: 103 mmol/L (ref 96–106)
Creatinine, Ser: 0.7 mg/dL (ref 0.57–1.00)
Globulin, Total: 2.1 g/dL (ref 1.5–4.5)
Glucose: 86 mg/dL (ref 70–99)
Potassium: 4.4 mmol/L (ref 3.5–5.2)
Sodium: 141 mmol/L (ref 134–144)
Total Protein: 6.9 g/dL (ref 6.0–8.5)
eGFR: 102 mL/min/{1.73_m2} (ref >59)

## 2023-09-01 LAB — B12 AND FOLATE PANEL
Folate: >20 ng/mL (ref >3.0)
Vitamin B-12: 846 pg/mL (ref 232–1245)

## 2023-09-01 LAB — LIPID PANEL
Chol/HDL Ratio: 3.3 ratio (ref 0.0–4.4)
Cholesterol, Total: 179 mg/dL (ref 100–199)
HDL: 55 mg/dL (ref >39)
LDL Chol Calc (NIH): 109 mg/dL — ABNORMAL HIGH (ref 0–99)
Triglycerides: 80 mg/dL (ref 0–149)
VLDL Cholesterol Cal: 15 mg/dL (ref 5–40)

## 2023-09-01 LAB — VITAMIN D 25 HYDROXY (VIT D DEFICIENCY, FRACTURES): Vit D, 25-Hydroxy: 106 ng/mL — ABNORMAL HIGH (ref 30.0–100.0)

## 2023-09-01 LAB — TSH: TSH: 2.41 u[IU]/mL (ref 0.450–4.500)

## 2023-09-02 ENCOUNTER — Other Ambulatory Visit: Payer: Self-pay | Admitting: Physician Assistant

## 2023-09-02 DIAGNOSIS — R3121 Asymptomatic microscopic hematuria: Secondary | ICD-10-CM

## 2023-09-02 LAB — URINALYSIS, ROUTINE W REFLEX MICROSCOPIC

## 2023-09-06 ENCOUNTER — Other Ambulatory Visit

## 2023-09-06 DIAGNOSIS — R3121 Asymptomatic microscopic hematuria: Secondary | ICD-10-CM

## 2023-09-06 LAB — URINALYSIS, ROUTINE W REFLEX MICROSCOPIC
Bilirubin, UA: NEGATIVE
Glucose, UA: NEGATIVE
Ketones, UA: NEGATIVE
Leukocytes,UA: NEGATIVE
Nitrite, UA: NEGATIVE
Protein,UA: NEGATIVE
RBC, UA: NEGATIVE
Specific Gravity, UA: 1.007 (ref 1.005–1.030)
Urobilinogen, Ur: 0.2 mg/dL (ref 0.2–1.0)
pH, UA: 7 (ref 5.0–7.5)

## 2023-09-09 ENCOUNTER — Encounter: Payer: Self-pay | Admitting: Family Medicine

## 2023-09-27 ENCOUNTER — Ambulatory Visit: Admission: RE | Admit: 2023-09-27 | Source: Ambulatory Visit

## 2024-08-28 ENCOUNTER — Encounter: Admitting: Physician Assistant
